# Patient Record
Sex: Female | Born: 1935 | Race: White | Hispanic: No | Smoking: Former smoker
Health system: Southern US, Community
[De-identification: ages and names within clinical notes are randomized; demographics above are authoritative.]

## PROBLEM LIST (undated history)

## (undated) DIAGNOSIS — I10 Essential (primary) hypertension: Secondary | ICD-10-CM

## (undated) DIAGNOSIS — E119 Type 2 diabetes mellitus without complications: Secondary | ICD-10-CM

## (undated) DIAGNOSIS — E785 Hyperlipidemia, unspecified: Secondary | ICD-10-CM

## (undated) DIAGNOSIS — E079 Disorder of thyroid, unspecified: Secondary | ICD-10-CM

## (undated) HISTORY — DX: Hyperlipidemia, unspecified: E78.5

## (undated) HISTORY — PX: BREAST BIOPSY: SHX20

## (undated) HISTORY — PX: GALLBLADDER SURGERY: SHX652

## (undated) HISTORY — DX: Essential (primary) hypertension: I10

## (undated) HISTORY — DX: Type 2 diabetes mellitus without complications: E11.9

## (undated) HISTORY — PX: EXCISIONAL HEMORRHOIDECTOMY: SHX1541

## (undated) HISTORY — DX: Disorder of thyroid, unspecified: E07.9

## (undated) HISTORY — PX: APPENDECTOMY: SHX54

---

## 2005-12-17 ENCOUNTER — Ambulatory Visit: Payer: Self-pay | Admitting: General Surgery

## 2006-01-08 ENCOUNTER — Other Ambulatory Visit: Payer: Self-pay

## 2006-01-08 ENCOUNTER — Ambulatory Visit: Payer: Self-pay | Admitting: General Surgery

## 2006-01-15 ENCOUNTER — Ambulatory Visit: Payer: Self-pay | Admitting: General Surgery

## 2007-04-02 ENCOUNTER — Inpatient Hospital Stay: Payer: Self-pay | Admitting: Internal Medicine

## 2007-04-02 ENCOUNTER — Other Ambulatory Visit: Payer: Self-pay

## 2014-11-21 DIAGNOSIS — E119 Type 2 diabetes mellitus without complications: Secondary | ICD-10-CM | POA: Diagnosis not present

## 2014-11-21 DIAGNOSIS — N179 Acute kidney failure, unspecified: Secondary | ICD-10-CM | POA: Diagnosis not present

## 2014-11-21 DIAGNOSIS — M7989 Other specified soft tissue disorders: Secondary | ICD-10-CM | POA: Diagnosis not present

## 2014-11-21 DIAGNOSIS — E039 Hypothyroidism, unspecified: Secondary | ICD-10-CM | POA: Diagnosis not present

## 2014-11-21 DIAGNOSIS — I10 Essential (primary) hypertension: Secondary | ICD-10-CM | POA: Diagnosis not present

## 2014-11-28 ENCOUNTER — Encounter: Payer: Self-pay | Admitting: *Deleted

## 2014-11-28 ENCOUNTER — Ambulatory Visit: Payer: Self-pay | Admitting: Cardiovascular Disease

## 2014-11-30 ENCOUNTER — Ambulatory Visit: Payer: Self-pay | Admitting: Nephrology

## 2014-11-30 DIAGNOSIS — N183 Chronic kidney disease, stage 3 (moderate): Secondary | ICD-10-CM | POA: Diagnosis not present

## 2014-12-01 ENCOUNTER — Inpatient Hospital Stay: Payer: Self-pay | Admitting: Internal Medicine

## 2014-12-01 DIAGNOSIS — R319 Hematuria, unspecified: Secondary | ICD-10-CM | POA: Diagnosis not present

## 2014-12-01 DIAGNOSIS — D649 Anemia, unspecified: Secondary | ICD-10-CM | POA: Diagnosis not present

## 2014-12-01 DIAGNOSIS — N040 Nephrotic syndrome with minor glomerular abnormality: Secondary | ICD-10-CM | POA: Diagnosis not present

## 2014-12-01 DIAGNOSIS — I120 Hypertensive chronic kidney disease with stage 5 chronic kidney disease or end stage renal disease: Secondary | ICD-10-CM | POA: Diagnosis not present

## 2014-12-01 DIAGNOSIS — N049 Nephrotic syndrome with unspecified morphologic changes: Secondary | ICD-10-CM | POA: Diagnosis not present

## 2014-12-01 DIAGNOSIS — D72829 Elevated white blood cell count, unspecified: Secondary | ICD-10-CM | POA: Diagnosis not present

## 2014-12-01 DIAGNOSIS — E119 Type 2 diabetes mellitus without complications: Secondary | ICD-10-CM | POA: Diagnosis not present

## 2014-12-01 DIAGNOSIS — Z6825 Body mass index (BMI) 25.0-25.9, adult: Secondary | ICD-10-CM | POA: Diagnosis not present

## 2014-12-01 DIAGNOSIS — R81 Glycosuria: Secondary | ICD-10-CM | POA: Diagnosis not present

## 2014-12-01 DIAGNOSIS — N170 Acute kidney failure with tubular necrosis: Secondary | ICD-10-CM | POA: Diagnosis not present

## 2014-12-01 DIAGNOSIS — L03114 Cellulitis of left upper limb: Secondary | ICD-10-CM | POA: Diagnosis not present

## 2014-12-01 DIAGNOSIS — Z87891 Personal history of nicotine dependence: Secondary | ICD-10-CM | POA: Diagnosis not present

## 2014-12-01 DIAGNOSIS — R2232 Localized swelling, mass and lump, left upper limb: Secondary | ICD-10-CM | POA: Diagnosis not present

## 2014-12-01 DIAGNOSIS — R531 Weakness: Secondary | ICD-10-CM | POA: Diagnosis not present

## 2014-12-01 DIAGNOSIS — M7989 Other specified soft tissue disorders: Secondary | ICD-10-CM | POA: Diagnosis not present

## 2014-12-01 DIAGNOSIS — F329 Major depressive disorder, single episode, unspecified: Secondary | ICD-10-CM | POA: Diagnosis not present

## 2014-12-01 DIAGNOSIS — R601 Generalized edema: Secondary | ICD-10-CM | POA: Diagnosis not present

## 2014-12-01 DIAGNOSIS — R600 Localized edema: Secondary | ICD-10-CM | POA: Diagnosis not present

## 2014-12-01 DIAGNOSIS — N179 Acute kidney failure, unspecified: Secondary | ICD-10-CM | POA: Diagnosis not present

## 2014-12-01 DIAGNOSIS — M1990 Unspecified osteoarthritis, unspecified site: Secondary | ICD-10-CM | POA: Diagnosis not present

## 2014-12-01 DIAGNOSIS — R809 Proteinuria, unspecified: Secondary | ICD-10-CM | POA: Diagnosis not present

## 2014-12-01 DIAGNOSIS — E039 Hypothyroidism, unspecified: Secondary | ICD-10-CM | POA: Diagnosis not present

## 2014-12-01 DIAGNOSIS — N189 Chronic kidney disease, unspecified: Secondary | ICD-10-CM | POA: Diagnosis not present

## 2014-12-01 DIAGNOSIS — E1122 Type 2 diabetes mellitus with diabetic chronic kidney disease: Secondary | ICD-10-CM | POA: Diagnosis not present

## 2014-12-01 DIAGNOSIS — E1140 Type 2 diabetes mellitus with diabetic neuropathy, unspecified: Secondary | ICD-10-CM | POA: Diagnosis not present

## 2014-12-01 DIAGNOSIS — T380X5A Adverse effect of glucocorticoids and synthetic analogues, initial encounter: Secondary | ICD-10-CM | POA: Diagnosis not present

## 2014-12-01 DIAGNOSIS — R310 Gross hematuria: Secondary | ICD-10-CM | POA: Diagnosis not present

## 2014-12-01 DIAGNOSIS — I129 Hypertensive chronic kidney disease with stage 1 through stage 4 chronic kidney disease, or unspecified chronic kidney disease: Secondary | ICD-10-CM | POA: Diagnosis not present

## 2014-12-01 DIAGNOSIS — Z992 Dependence on renal dialysis: Secondary | ICD-10-CM | POA: Diagnosis not present

## 2014-12-01 DIAGNOSIS — Z833 Family history of diabetes mellitus: Secondary | ICD-10-CM | POA: Diagnosis not present

## 2014-12-01 DIAGNOSIS — R05 Cough: Secondary | ICD-10-CM | POA: Diagnosis not present

## 2014-12-01 DIAGNOSIS — E1121 Type 2 diabetes mellitus with diabetic nephropathy: Secondary | ICD-10-CM | POA: Diagnosis not present

## 2014-12-01 DIAGNOSIS — I10 Essential (primary) hypertension: Secondary | ICD-10-CM | POA: Diagnosis not present

## 2014-12-01 DIAGNOSIS — E785 Hyperlipidemia, unspecified: Secondary | ICD-10-CM | POA: Diagnosis not present

## 2014-12-01 DIAGNOSIS — Z9981 Dependence on supplemental oxygen: Secondary | ICD-10-CM | POA: Diagnosis not present

## 2014-12-01 DIAGNOSIS — N183 Chronic kidney disease, stage 3 (moderate): Secondary | ICD-10-CM | POA: Diagnosis not present

## 2014-12-01 DIAGNOSIS — N186 End stage renal disease: Secondary | ICD-10-CM | POA: Diagnosis not present

## 2014-12-01 DIAGNOSIS — N050 Unspecified nephritic syndrome with minor glomerular abnormality: Secondary | ICD-10-CM | POA: Diagnosis not present

## 2014-12-01 LAB — BASIC METABOLIC PANEL
ANION GAP: 10 (ref 7–16)
BUN: 54 mg/dL — ABNORMAL HIGH (ref 7–18)
CALCIUM: 7.9 mg/dL — AB (ref 8.5–10.1)
CO2: 25 mmol/L (ref 21–32)
Chloride: 108 mmol/L — ABNORMAL HIGH (ref 98–107)
Creatinine: 4.56 mg/dL — ABNORMAL HIGH (ref 0.60–1.30)
GFR CALC AF AMER: 12 — AB
GFR CALC NON AF AMER: 10 — AB
Glucose: 105 mg/dL — ABNORMAL HIGH (ref 65–99)
OSMOLALITY: 300 (ref 275–301)
Potassium: 3.5 mmol/L (ref 3.5–5.1)
Sodium: 143 mmol/L (ref 136–145)

## 2014-12-01 LAB — CBC WITH DIFFERENTIAL/PLATELET
Basophil #: 0 10*3/uL (ref 0.0–0.1)
Basophil %: 0.5 %
Eosinophil #: 0.2 10*3/uL (ref 0.0–0.7)
Eosinophil %: 2.5 %
HCT: 35.4 % (ref 35.0–47.0)
HGB: 11.6 g/dL — ABNORMAL LOW (ref 12.0–16.0)
LYMPHS ABS: 1.3 10*3/uL (ref 1.0–3.6)
LYMPHS PCT: 20.1 %
MCH: 29.3 pg (ref 26.0–34.0)
MCHC: 32.9 g/dL (ref 32.0–36.0)
MCV: 89 fL (ref 80–100)
MONOS PCT: 7 %
Monocyte #: 0.5 x10 3/mm (ref 0.2–0.9)
Neutrophil #: 4.5 10*3/uL (ref 1.4–6.5)
Neutrophil %: 69.9 %
Platelet: 250 10*3/uL (ref 150–440)
RBC: 3.97 10*6/uL (ref 3.80–5.20)
RDW: 13.7 % (ref 11.5–14.5)
WBC: 6.4 10*3/uL (ref 3.6–11.0)

## 2014-12-01 LAB — PROTIME-INR
INR: 0.9
Prothrombin Time: 11.8 secs (ref 11.5–14.7)

## 2014-12-01 LAB — TSH: THYROID STIMULATING HORM: 19.5 u[IU]/mL — AB

## 2014-12-02 LAB — BASIC METABOLIC PANEL
Anion Gap: 9 (ref 7–16)
BUN: 53 mg/dL — ABNORMAL HIGH (ref 7–18)
CALCIUM: 7.1 mg/dL — AB (ref 8.5–10.1)
Chloride: 111 mmol/L — ABNORMAL HIGH (ref 98–107)
Co2: 24 mmol/L (ref 21–32)
Creatinine: 4.44 mg/dL — ABNORMAL HIGH (ref 0.60–1.30)
EGFR (African American): 12 — ABNORMAL LOW
EGFR (Non-African Amer.): 10 — ABNORMAL LOW
Glucose: 78 mg/dL (ref 65–99)
OSMOLALITY: 300 (ref 275–301)
POTASSIUM: 3.5 mmol/L (ref 3.5–5.1)
Sodium: 144 mmol/L (ref 136–145)

## 2014-12-02 LAB — URINALYSIS, COMPLETE
Bilirubin,UR: NEGATIVE
Glucose,UR: >=500 mg/dL (ref 0–75)
Ketone: NEGATIVE
Leukocyte Esterase: NEGATIVE
NITRITE: NEGATIVE
Ph: 5 (ref 4.5–8.0)
Specific Gravity: 1.014 (ref 1.003–1.030)
Squamous Epithelial: 4

## 2014-12-02 LAB — PROTEIN / CREATININE RATIO, URINE
Creatinine, Urine: 117.6 mg/dL (ref 30.0–125.0)
PROTEIN/CREAT. RATIO: 9770 mg/g{creat} — AB (ref 0–200)
Protein, Random Urine: 1149 mg/dL — ABNORMAL HIGH (ref 0–12)

## 2014-12-03 LAB — CBC WITH DIFFERENTIAL/PLATELET
BASOS ABS: 0 10*3/uL (ref 0.0–0.1)
Basophil %: 0.1 %
Eosinophil #: 0 10*3/uL (ref 0.0–0.7)
Eosinophil %: 0 %
HCT: 33.4 % — AB (ref 35.0–47.0)
HGB: 11 g/dL — ABNORMAL LOW (ref 12.0–16.0)
LYMPHS ABS: 0.5 10*3/uL — AB (ref 1.0–3.6)
LYMPHS PCT: 4.6 %
MCH: 29.6 pg (ref 26.0–34.0)
MCHC: 33 g/dL (ref 32.0–36.0)
MCV: 90 fL (ref 80–100)
MONO ABS: 0.1 x10 3/mm — AB (ref 0.2–0.9)
MONOS PCT: 1.3 %
Neutrophil #: 10.3 10*3/uL — ABNORMAL HIGH (ref 1.4–6.5)
Neutrophil %: 94 %
PLATELETS: 297 10*3/uL (ref 150–440)
RBC: 3.73 10*6/uL — ABNORMAL LOW (ref 3.80–5.20)
RDW: 13.8 % (ref 11.5–14.5)
WBC: 10.9 10*3/uL (ref 3.6–11.0)

## 2014-12-03 LAB — BASIC METABOLIC PANEL
ANION GAP: 12 (ref 7–16)
BUN: 61 mg/dL — AB (ref 7–18)
CO2: 23 mmol/L (ref 21–32)
CREATININE: 5.03 mg/dL — AB (ref 0.60–1.30)
Calcium, Total: 7.5 mg/dL — ABNORMAL LOW (ref 8.5–10.1)
Chloride: 105 mmol/L (ref 98–107)
GFR CALC AF AMER: 11 — AB
GFR CALC NON AF AMER: 9 — AB
GLUCOSE: 232 mg/dL — AB (ref 65–99)
Osmolality: 304 (ref 275–301)
Potassium: 3.8 mmol/L (ref 3.5–5.1)
SODIUM: 140 mmol/L (ref 136–145)

## 2014-12-04 LAB — CBC WITH DIFFERENTIAL/PLATELET
BASOS PCT: 0.1 %
Basophil #: 0 10*3/uL (ref 0.0–0.1)
EOS PCT: 0 %
Eosinophil #: 0 10*3/uL (ref 0.0–0.7)
HCT: 27.1 % — AB (ref 35.0–47.0)
HGB: 8.9 g/dL — AB (ref 12.0–16.0)
LYMPHS PCT: 4.1 %
Lymphocyte #: 0.7 10*3/uL — ABNORMAL LOW (ref 1.0–3.6)
MCH: 28.9 pg (ref 26.0–34.0)
MCHC: 32.7 g/dL (ref 32.0–36.0)
MCV: 88 fL (ref 80–100)
Monocyte #: 0.6 x10 3/mm (ref 0.2–0.9)
Monocyte %: 3.6 %
Neutrophil #: 14.9 10*3/uL — ABNORMAL HIGH (ref 1.4–6.5)
Neutrophil %: 92.2 %
Platelet: 260 10*3/uL (ref 150–440)
RBC: 3.07 10*6/uL — ABNORMAL LOW (ref 3.80–5.20)
RDW: 14 % (ref 11.5–14.5)
WBC: 16.1 10*3/uL — ABNORMAL HIGH (ref 3.6–11.0)

## 2014-12-04 LAB — BASIC METABOLIC PANEL
Anion Gap: 11 (ref 7–16)
BUN: 67 mg/dL — AB (ref 7–18)
CHLORIDE: 105 mmol/L (ref 98–107)
CREATININE: 5.38 mg/dL — AB (ref 0.60–1.30)
Calcium, Total: 7.2 mg/dL — ABNORMAL LOW (ref 8.5–10.1)
Co2: 23 mmol/L (ref 21–32)
EGFR (African American): 10 — ABNORMAL LOW
EGFR (Non-African Amer.): 8 — ABNORMAL LOW
Glucose: 133 mg/dL — ABNORMAL HIGH (ref 65–99)
Osmolality: 299 (ref 275–301)
POTASSIUM: 3.9 mmol/L (ref 3.5–5.1)
Sodium: 139 mmol/L (ref 136–145)

## 2014-12-05 LAB — BASIC METABOLIC PANEL
Anion Gap: 12 (ref 7–16)
BUN: 79 mg/dL — ABNORMAL HIGH (ref 7–18)
CALCIUM: 7.3 mg/dL — AB (ref 8.5–10.1)
CO2: 21 mmol/L (ref 21–32)
Chloride: 104 mmol/L (ref 98–107)
Creatinine: 5.61 mg/dL — ABNORMAL HIGH (ref 0.60–1.30)
EGFR (Non-African Amer.): 8 — ABNORMAL LOW
GFR CALC AF AMER: 9 — AB
Glucose: 154 mg/dL — ABNORMAL HIGH (ref 65–99)
OSMOLALITY: 301 (ref 275–301)
Potassium: 4.1 mmol/L (ref 3.5–5.1)
SODIUM: 137 mmol/L (ref 136–145)

## 2014-12-05 LAB — CBC WITH DIFFERENTIAL/PLATELET
Basophil #: 0 10*3/uL (ref 0.0–0.1)
Basophil %: 0.1 %
Eosinophil #: 0 10*3/uL (ref 0.0–0.7)
Eosinophil %: 0 %
HCT: 28.2 % — AB (ref 35.0–47.0)
HGB: 9.4 g/dL — ABNORMAL LOW (ref 12.0–16.0)
LYMPHS ABS: 0.4 10*3/uL — AB (ref 1.0–3.6)
Lymphocyte %: 2.7 %
MCH: 29.3 pg (ref 26.0–34.0)
MCHC: 33.2 g/dL (ref 32.0–36.0)
MCV: 88 fL (ref 80–100)
MONO ABS: 0.6 x10 3/mm (ref 0.2–0.9)
MONOS PCT: 3.7 %
NEUTROS ABS: 15.2 10*3/uL — AB (ref 1.4–6.5)
Neutrophil %: 93.5 %
PLATELETS: 273 10*3/uL (ref 150–440)
RBC: 3.2 10*6/uL — AB (ref 3.80–5.20)
RDW: 13.8 % (ref 11.5–14.5)
WBC: 16.3 10*3/uL — ABNORMAL HIGH (ref 3.6–11.0)

## 2014-12-06 LAB — BASIC METABOLIC PANEL
ANION GAP: 11 (ref 7–16)
BUN: 87 mg/dL — ABNORMAL HIGH (ref 7–18)
CHLORIDE: 103 mmol/L (ref 98–107)
Calcium, Total: 6.7 mg/dL — CL (ref 8.5–10.1)
Co2: 21 mmol/L (ref 21–32)
Creatinine: 5.39 mg/dL — ABNORMAL HIGH (ref 0.60–1.30)
EGFR (Non-African Amer.): 8 — ABNORMAL LOW
GFR CALC AF AMER: 10 — AB
GLUCOSE: 81 mg/dL (ref 65–99)
OSMOLALITY: 296 (ref 275–301)
Potassium: 4.4 mmol/L (ref 3.5–5.1)
Sodium: 135 mmol/L — ABNORMAL LOW (ref 136–145)

## 2014-12-06 LAB — CBC WITH DIFFERENTIAL/PLATELET
BASOS PCT: 0.1 %
Basophil #: 0 10*3/uL (ref 0.0–0.1)
EOS PCT: 0 %
Eosinophil #: 0 10*3/uL (ref 0.0–0.7)
HCT: 28.8 % — AB (ref 35.0–47.0)
HGB: 9.3 g/dL — ABNORMAL LOW (ref 12.0–16.0)
LYMPHS ABS: 1.2 10*3/uL (ref 1.0–3.6)
Lymphocyte %: 7.7 %
MCH: 29 pg (ref 26.0–34.0)
MCHC: 32.5 g/dL (ref 32.0–36.0)
MCV: 89 fL (ref 80–100)
MONO ABS: 1.3 x10 3/mm — AB (ref 0.2–0.9)
MONOS PCT: 8 %
NEUTROS ABS: 13.2 10*3/uL — AB (ref 1.4–6.5)
Neutrophil %: 84.2 %
Platelet: 270 10*3/uL (ref 150–440)
RBC: 3.22 10*6/uL — AB (ref 3.80–5.20)
RDW: 13.8 % (ref 11.5–14.5)
WBC: 15.7 10*3/uL — AB (ref 3.6–11.0)

## 2014-12-07 LAB — COMPREHENSIVE METABOLIC PANEL
ANION GAP: 11 (ref 7–16)
Albumin: 1.5 g/dL — ABNORMAL LOW (ref 3.4–5.0)
Alkaline Phosphatase: 66 U/L
BUN: 90 mg/dL — ABNORMAL HIGH (ref 7–18)
Bilirubin,Total: 0.2 mg/dL (ref 0.2–1.0)
CALCIUM: 7.2 mg/dL — AB (ref 8.5–10.1)
CO2: 20 mmol/L — AB (ref 21–32)
Chloride: 103 mmol/L (ref 98–107)
Creatinine: 5.6 mg/dL — ABNORMAL HIGH (ref 0.60–1.30)
EGFR (African American): 9 — ABNORMAL LOW
GFR CALC NON AF AMER: 8 — AB
Glucose: 71 mg/dL (ref 65–99)
Osmolality: 294 (ref 275–301)
POTASSIUM: 4.5 mmol/L (ref 3.5–5.1)
SGOT(AST): 25 U/L (ref 15–37)
SGPT (ALT): 18 U/L
Sodium: 134 mmol/L — ABNORMAL LOW (ref 136–145)
Total Protein: 4.6 g/dL — ABNORMAL LOW (ref 6.4–8.2)

## 2014-12-08 LAB — BASIC METABOLIC PANEL
Anion Gap: 8 (ref 7–16)
BUN: 82 mg/dL — ABNORMAL HIGH (ref 7–18)
CREATININE: 5.25 mg/dL — AB (ref 0.60–1.30)
Calcium, Total: 7.2 mg/dL — ABNORMAL LOW (ref 8.5–10.1)
Chloride: 103 mmol/L (ref 98–107)
Co2: 25 mmol/L (ref 21–32)
GFR CALC AF AMER: 10 — AB
GFR CALC NON AF AMER: 8 — AB
Glucose: 67 mg/dL (ref 65–99)
Osmolality: 295 (ref 275–301)
Potassium: 4.5 mmol/L (ref 3.5–5.1)
Sodium: 136 mmol/L (ref 136–145)

## 2014-12-08 LAB — URINALYSIS, COMPLETE
RBC,UR: 2252 /HPF (ref 0–5)
SPECIFIC GRAVITY: 1.02 (ref 1.003–1.030)
Squamous Epithelial: 7

## 2014-12-08 LAB — PROTEIN / CREATININE RATIO, URINE
CREATININE, URINE: 117.5 mg/dL (ref 30.0–125.0)
PROTEIN/CREAT. RATIO: 14911 mg/g{creat} — AB (ref 0–200)
Protein, Random Urine: 1752 mg/dL — ABNORMAL HIGH (ref 0–12)

## 2014-12-09 LAB — BASIC METABOLIC PANEL
ANION GAP: 9 (ref 7–16)
BUN: 54 mg/dL — AB (ref 7–18)
CALCIUM: 7.3 mg/dL — AB (ref 8.5–10.1)
CO2: 25 mmol/L (ref 21–32)
CREATININE: 4.17 mg/dL — AB (ref 0.60–1.30)
Chloride: 101 mmol/L (ref 98–107)
EGFR (Non-African Amer.): 11 — ABNORMAL LOW
GFR CALC AF AMER: 13 — AB
Glucose: 78 mg/dL (ref 65–99)
OSMOLALITY: 284 (ref 275–301)
Potassium: 4 mmol/L (ref 3.5–5.1)
SODIUM: 135 mmol/L — AB (ref 136–145)

## 2014-12-09 LAB — PHOSPHORUS: PHOSPHORUS: 6.4 mg/dL — AB (ref 2.5–4.9)

## 2014-12-09 LAB — PLATELET COUNT: Platelet: 196 10*3/uL (ref 150–440)

## 2014-12-10 DIAGNOSIS — R319 Hematuria, unspecified: Secondary | ICD-10-CM | POA: Diagnosis not present

## 2014-12-10 DIAGNOSIS — I10 Essential (primary) hypertension: Secondary | ICD-10-CM | POA: Diagnosis not present

## 2014-12-10 LAB — BASIC METABOLIC PANEL
Anion Gap: 6 — ABNORMAL LOW (ref 7–16)
BUN: 28 mg/dL — ABNORMAL HIGH (ref 7–18)
CO2: 32 mmol/L (ref 21–32)
CREATININE: 2.87 mg/dL — AB (ref 0.60–1.30)
Calcium, Total: 7.2 mg/dL — ABNORMAL LOW (ref 8.5–10.1)
Chloride: 102 mmol/L (ref 98–107)
GFR CALC AF AMER: 20 — AB
GFR CALC NON AF AMER: 17 — AB
Glucose: 89 mg/dL (ref 65–99)
Osmolality: 284 (ref 275–301)
POTASSIUM: 3.8 mmol/L (ref 3.5–5.1)
SODIUM: 140 mmol/L (ref 136–145)

## 2014-12-10 LAB — CBC WITH DIFFERENTIAL/PLATELET
BASOS ABS: 0 10*3/uL (ref 0.0–0.1)
BASOS PCT: 0.1 %
EOS ABS: 0.2 10*3/uL (ref 0.0–0.7)
Eosinophil %: 1.7 %
HCT: 25.5 % — ABNORMAL LOW (ref 35.0–47.0)
HGB: 8.5 g/dL — ABNORMAL LOW (ref 12.0–16.0)
LYMPHS PCT: 9.5 %
Lymphocyte #: 0.9 10*3/uL — ABNORMAL LOW (ref 1.0–3.6)
MCH: 29.3 pg (ref 26.0–34.0)
MCHC: 33.4 g/dL (ref 32.0–36.0)
MCV: 88 fL (ref 80–100)
MONO ABS: 1 x10 3/mm — AB (ref 0.2–0.9)
Monocyte %: 10.7 %
NEUTROS PCT: 78 %
Neutrophil #: 7.3 10*3/uL — ABNORMAL HIGH (ref 1.4–6.5)
PLATELETS: 178 10*3/uL (ref 150–440)
RBC: 2.91 10*6/uL — ABNORMAL LOW (ref 3.80–5.20)
RDW: 13.5 % (ref 11.5–14.5)
WBC: 9.4 10*3/uL (ref 3.6–11.0)

## 2014-12-11 LAB — BASIC METABOLIC PANEL
Anion Gap: 7 (ref 7–16)
BUN: 34 mg/dL — ABNORMAL HIGH (ref 7–18)
CALCIUM: 7.3 mg/dL — AB (ref 8.5–10.1)
CO2: 30 mmol/L (ref 21–32)
Chloride: 100 mmol/L (ref 98–107)
Creatinine: 3.8 mg/dL — ABNORMAL HIGH (ref 0.60–1.30)
GFR CALC AF AMER: 15 — AB
GFR CALC NON AF AMER: 12 — AB
GLUCOSE: 99 mg/dL (ref 65–99)
OSMOLALITY: 281 (ref 275–301)
Potassium: 3.7 mmol/L (ref 3.5–5.1)
SODIUM: 137 mmol/L (ref 136–145)

## 2014-12-11 LAB — HEMOGLOBIN: HGB: 10.2 g/dL — ABNORMAL LOW (ref 12.0–16.0)

## 2014-12-12 LAB — BASIC METABOLIC PANEL
ANION GAP: 9 (ref 7–16)
BUN: 42 mg/dL — ABNORMAL HIGH (ref 7–18)
CALCIUM: 7.1 mg/dL — AB (ref 8.5–10.1)
CO2: 29 mmol/L (ref 21–32)
Chloride: 99 mmol/L (ref 98–107)
Creatinine: 4.2 mg/dL — ABNORMAL HIGH (ref 0.60–1.30)
EGFR (African American): 13 — ABNORMAL LOW
EGFR (Non-African Amer.): 11 — ABNORMAL LOW
Glucose: 82 mg/dL (ref 65–99)
OSMOLALITY: 283 (ref 275–301)
Potassium: 3.7 mmol/L (ref 3.5–5.1)
Sodium: 137 mmol/L (ref 136–145)

## 2014-12-12 LAB — URINALYSIS, COMPLETE
Bacteria: NONE SEEN
Bilirubin,UR: NEGATIVE
Glucose,UR: >=500 mg/dL (ref 0–75)
Hyaline Cast: 10
Ketone: NEGATIVE
Leukocyte Esterase: NEGATIVE
Nitrite: NEGATIVE
PH: 6 (ref 4.5–8.0)
Specific Gravity: 1.028 (ref 1.003–1.030)
Squamous Epithelial: 15

## 2014-12-12 LAB — PHOSPHORUS: PHOSPHORUS: 5.3 mg/dL — AB (ref 2.5–4.9)

## 2014-12-14 DIAGNOSIS — I120 Hypertensive chronic kidney disease with stage 5 chronic kidney disease or end stage renal disease: Secondary | ICD-10-CM | POA: Diagnosis not present

## 2014-12-14 DIAGNOSIS — Z885 Allergy status to narcotic agent status: Secondary | ICD-10-CM | POA: Diagnosis not present

## 2014-12-14 DIAGNOSIS — N189 Chronic kidney disease, unspecified: Secondary | ICD-10-CM | POA: Diagnosis not present

## 2014-12-14 DIAGNOSIS — G40209 Localization-related (focal) (partial) symptomatic epilepsy and epileptic syndromes with complex partial seizures, not intractable, without status epilepticus: Secondary | ICD-10-CM | POA: Diagnosis not present

## 2014-12-14 DIAGNOSIS — B259 Cytomegaloviral disease, unspecified: Secondary | ICD-10-CM | POA: Diagnosis not present

## 2014-12-14 DIAGNOSIS — R410 Disorientation, unspecified: Secondary | ICD-10-CM | POA: Diagnosis not present

## 2014-12-14 DIAGNOSIS — F0390 Unspecified dementia without behavioral disturbance: Secondary | ICD-10-CM | POA: Diagnosis not present

## 2014-12-14 DIAGNOSIS — E119 Type 2 diabetes mellitus without complications: Secondary | ICD-10-CM | POA: Diagnosis not present

## 2014-12-14 DIAGNOSIS — G319 Degenerative disease of nervous system, unspecified: Secondary | ICD-10-CM | POA: Diagnosis not present

## 2014-12-14 DIAGNOSIS — E039 Hypothyroidism, unspecified: Secondary | ICD-10-CM | POA: Diagnosis not present

## 2014-12-14 DIAGNOSIS — D631 Anemia in chronic kidney disease: Secondary | ICD-10-CM | POA: Diagnosis not present

## 2014-12-14 DIAGNOSIS — M1990 Unspecified osteoarthritis, unspecified site: Secondary | ICD-10-CM | POA: Diagnosis not present

## 2014-12-14 DIAGNOSIS — Z79891 Long term (current) use of opiate analgesic: Secondary | ICD-10-CM | POA: Diagnosis not present

## 2014-12-14 DIAGNOSIS — G92 Toxic encephalopathy: Secondary | ICD-10-CM | POA: Diagnosis not present

## 2014-12-14 DIAGNOSIS — R319 Hematuria, unspecified: Secondary | ICD-10-CM | POA: Diagnosis not present

## 2014-12-14 DIAGNOSIS — D649 Anemia, unspecified: Secondary | ICD-10-CM | POA: Diagnosis not present

## 2014-12-14 DIAGNOSIS — R55 Syncope and collapse: Secondary | ICD-10-CM | POA: Diagnosis not present

## 2014-12-14 DIAGNOSIS — I313 Pericardial effusion (noninflammatory): Secondary | ICD-10-CM | POA: Diagnosis not present

## 2014-12-14 DIAGNOSIS — E1122 Type 2 diabetes mellitus with diabetic chronic kidney disease: Secondary | ICD-10-CM | POA: Diagnosis not present

## 2014-12-14 DIAGNOSIS — R601 Generalized edema: Secondary | ICD-10-CM | POA: Diagnosis not present

## 2014-12-14 DIAGNOSIS — Z881 Allergy status to other antibiotic agents status: Secondary | ICD-10-CM | POA: Diagnosis not present

## 2014-12-14 DIAGNOSIS — E1140 Type 2 diabetes mellitus with diabetic neuropathy, unspecified: Secondary | ICD-10-CM | POA: Diagnosis not present

## 2014-12-14 DIAGNOSIS — R310 Gross hematuria: Secondary | ICD-10-CM | POA: Diagnosis not present

## 2014-12-14 DIAGNOSIS — E876 Hypokalemia: Secondary | ICD-10-CM | POA: Diagnosis not present

## 2014-12-14 DIAGNOSIS — Z7952 Long term (current) use of systemic steroids: Secondary | ICD-10-CM | POA: Diagnosis not present

## 2014-12-14 DIAGNOSIS — R7989 Other specified abnormal findings of blood chemistry: Secondary | ICD-10-CM | POA: Diagnosis not present

## 2014-12-14 DIAGNOSIS — R569 Unspecified convulsions: Secondary | ICD-10-CM | POA: Diagnosis not present

## 2014-12-14 DIAGNOSIS — N186 End stage renal disease: Secondary | ICD-10-CM | POA: Diagnosis not present

## 2014-12-14 DIAGNOSIS — E785 Hyperlipidemia, unspecified: Secondary | ICD-10-CM | POA: Diagnosis not present

## 2014-12-14 DIAGNOSIS — R401 Stupor: Secondary | ICD-10-CM | POA: Diagnosis not present

## 2014-12-14 DIAGNOSIS — Z992 Dependence on renal dialysis: Secondary | ICD-10-CM | POA: Diagnosis not present

## 2014-12-14 DIAGNOSIS — I10 Essential (primary) hypertension: Secondary | ICD-10-CM | POA: Diagnosis not present

## 2014-12-14 DIAGNOSIS — Z7982 Long term (current) use of aspirin: Secondary | ICD-10-CM | POA: Diagnosis not present

## 2014-12-14 DIAGNOSIS — D72829 Elevated white blood cell count, unspecified: Secondary | ICD-10-CM | POA: Diagnosis not present

## 2014-12-14 DIAGNOSIS — I129 Hypertensive chronic kidney disease with stage 1 through stage 4 chronic kidney disease, or unspecified chronic kidney disease: Secondary | ICD-10-CM | POA: Diagnosis not present

## 2014-12-14 DIAGNOSIS — Z9981 Dependence on supplemental oxygen: Secondary | ICD-10-CM | POA: Diagnosis not present

## 2014-12-14 DIAGNOSIS — N050 Unspecified nephritic syndrome with minor glomerular abnormality: Secondary | ICD-10-CM | POA: Diagnosis not present

## 2014-12-14 DIAGNOSIS — T380X5A Adverse effect of glucocorticoids and synthetic analogues, initial encounter: Secondary | ICD-10-CM | POA: Diagnosis not present

## 2014-12-14 DIAGNOSIS — R809 Proteinuria, unspecified: Secondary | ICD-10-CM | POA: Diagnosis not present

## 2014-12-14 DIAGNOSIS — G936 Cerebral edema: Secondary | ICD-10-CM | POA: Diagnosis not present

## 2014-12-14 DIAGNOSIS — F329 Major depressive disorder, single episode, unspecified: Secondary | ICD-10-CM | POA: Diagnosis not present

## 2014-12-14 DIAGNOSIS — R32 Unspecified urinary incontinence: Secondary | ICD-10-CM | POA: Diagnosis not present

## 2014-12-14 DIAGNOSIS — Z9109 Other allergy status, other than to drugs and biological substances: Secondary | ICD-10-CM | POA: Diagnosis not present

## 2014-12-14 DIAGNOSIS — I63519 Cerebral infarction due to unspecified occlusion or stenosis of unspecified middle cerebral artery: Secondary | ICD-10-CM | POA: Diagnosis not present

## 2014-12-14 DIAGNOSIS — I469 Cardiac arrest, cause unspecified: Secondary | ICD-10-CM | POA: Diagnosis not present

## 2014-12-14 DIAGNOSIS — I959 Hypotension, unspecified: Secondary | ICD-10-CM | POA: Diagnosis not present

## 2014-12-14 DIAGNOSIS — N179 Acute kidney failure, unspecified: Secondary | ICD-10-CM | POA: Diagnosis not present

## 2014-12-14 DIAGNOSIS — R531 Weakness: Secondary | ICD-10-CM | POA: Diagnosis not present

## 2014-12-14 DIAGNOSIS — E1121 Type 2 diabetes mellitus with diabetic nephropathy: Secondary | ICD-10-CM | POA: Diagnosis not present

## 2014-12-14 LAB — CBC WITH DIFFERENTIAL/PLATELET
BASOS ABS: 0 10*3/uL (ref 0.0–0.1)
Basophil %: 0.1 %
Eosinophil #: 0 10*3/uL (ref 0.0–0.7)
Eosinophil %: 0 %
HCT: 27.9 % — ABNORMAL LOW (ref 35.0–47.0)
HGB: 9.4 g/dL — ABNORMAL LOW (ref 12.0–16.0)
Lymphocyte #: 0.7 10*3/uL — ABNORMAL LOW (ref 1.0–3.6)
Lymphocyte %: 4.7 %
MCH: 29.4 pg (ref 26.0–34.0)
MCHC: 33.7 g/dL (ref 32.0–36.0)
MCV: 87 fL (ref 80–100)
MONOS PCT: 5.1 %
Monocyte #: 0.7 x10 3/mm (ref 0.2–0.9)
Neutrophil #: 12.6 10*3/uL — ABNORMAL HIGH (ref 1.4–6.5)
Neutrophil %: 90.1 %
PLATELETS: 181 10*3/uL (ref 150–440)
RBC: 3.2 10*6/uL — AB (ref 3.80–5.20)
RDW: 13.6 % (ref 11.5–14.5)
WBC: 14 10*3/uL — ABNORMAL HIGH (ref 3.6–11.0)

## 2014-12-14 LAB — BASIC METABOLIC PANEL
Anion Gap: 8 (ref 7–16)
BUN: 38 mg/dL — AB (ref 7–18)
CALCIUM: 7.3 mg/dL — AB (ref 8.5–10.1)
Chloride: 100 mmol/L (ref 98–107)
Co2: 31 mmol/L (ref 21–32)
Creatinine: 3.87 mg/dL — ABNORMAL HIGH (ref 0.60–1.30)
GFR CALC AF AMER: 14 — AB
GFR CALC NON AF AMER: 12 — AB
Glucose: 112 mg/dL — ABNORMAL HIGH (ref 65–99)
Osmolality: 287 (ref 275–301)
Potassium: 4 mmol/L (ref 3.5–5.1)
SODIUM: 139 mmol/L (ref 136–145)

## 2014-12-14 LAB — PROTEIN, URINE, 24 HOUR
COLLECTION HOURS: 24 h
Protein, Urine: >2000 mg/dL — ABNORMAL HIGH (ref 0–12)
Total Volume: 100 mL

## 2014-12-14 LAB — PHOSPHORUS: PHOSPHORUS: 4.8 mg/dL (ref 2.5–4.9)

## 2014-12-15 DIAGNOSIS — I129 Hypertensive chronic kidney disease with stage 1 through stage 4 chronic kidney disease, or unspecified chronic kidney disease: Secondary | ICD-10-CM | POA: Diagnosis not present

## 2014-12-15 DIAGNOSIS — D72829 Elevated white blood cell count, unspecified: Secondary | ICD-10-CM | POA: Diagnosis not present

## 2014-12-15 DIAGNOSIS — E1140 Type 2 diabetes mellitus with diabetic neuropathy, unspecified: Secondary | ICD-10-CM | POA: Diagnosis not present

## 2014-12-15 DIAGNOSIS — R601 Generalized edema: Secondary | ICD-10-CM | POA: Diagnosis not present

## 2014-12-16 LAB — URINE CULTURE

## 2014-12-19 ENCOUNTER — Ambulatory Visit: Payer: Self-pay | Admitting: Internal Medicine

## 2014-12-20 DIAGNOSIS — N050 Unspecified nephritic syndrome with minor glomerular abnormality: Secondary | ICD-10-CM | POA: Diagnosis not present

## 2014-12-20 DIAGNOSIS — R601 Generalized edema: Secondary | ICD-10-CM | POA: Diagnosis not present

## 2014-12-20 DIAGNOSIS — R809 Proteinuria, unspecified: Secondary | ICD-10-CM | POA: Diagnosis not present

## 2014-12-20 DIAGNOSIS — R319 Hematuria, unspecified: Secondary | ICD-10-CM | POA: Diagnosis not present

## 2014-12-20 DIAGNOSIS — E785 Hyperlipidemia, unspecified: Secondary | ICD-10-CM | POA: Diagnosis not present

## 2014-12-28 DIAGNOSIS — R809 Proteinuria, unspecified: Secondary | ICD-10-CM | POA: Diagnosis not present

## 2014-12-28 DIAGNOSIS — R601 Generalized edema: Secondary | ICD-10-CM | POA: Diagnosis not present

## 2014-12-28 DIAGNOSIS — N179 Acute kidney failure, unspecified: Secondary | ICD-10-CM | POA: Diagnosis not present

## 2014-12-28 DIAGNOSIS — E1122 Type 2 diabetes mellitus with diabetic chronic kidney disease: Secondary | ICD-10-CM | POA: Diagnosis not present

## 2014-12-28 DIAGNOSIS — N050 Unspecified nephritic syndrome with minor glomerular abnormality: Secondary | ICD-10-CM | POA: Diagnosis not present

## 2015-01-03 ENCOUNTER — Inpatient Hospital Stay: Payer: Self-pay | Admitting: Internal Medicine

## 2015-01-03 DIAGNOSIS — I6523 Occlusion and stenosis of bilateral carotid arteries: Secondary | ICD-10-CM | POA: Diagnosis not present

## 2015-01-03 DIAGNOSIS — I10 Essential (primary) hypertension: Secondary | ICD-10-CM | POA: Diagnosis not present

## 2015-01-03 DIAGNOSIS — N050 Unspecified nephritic syndrome with minor glomerular abnormality: Secondary | ICD-10-CM | POA: Diagnosis not present

## 2015-01-03 DIAGNOSIS — E1140 Type 2 diabetes mellitus with diabetic neuropathy, unspecified: Secondary | ICD-10-CM | POA: Diagnosis not present

## 2015-01-03 DIAGNOSIS — R29810 Facial weakness: Secondary | ICD-10-CM | POA: Diagnosis not present

## 2015-01-03 DIAGNOSIS — I499 Cardiac arrhythmia, unspecified: Secondary | ICD-10-CM | POA: Diagnosis not present

## 2015-01-03 DIAGNOSIS — Z515 Encounter for palliative care: Secondary | ICD-10-CM | POA: Diagnosis not present

## 2015-01-03 DIAGNOSIS — R839 Unspecified abnormal finding in cerebrospinal fluid: Secondary | ICD-10-CM | POA: Diagnosis not present

## 2015-01-03 DIAGNOSIS — Z7952 Long term (current) use of systemic steroids: Secondary | ICD-10-CM | POA: Diagnosis not present

## 2015-01-03 DIAGNOSIS — I69254 Hemiplegia and hemiparesis following other nontraumatic intracranial hemorrhage affecting left non-dominant side: Secondary | ICD-10-CM | POA: Diagnosis not present

## 2015-01-03 DIAGNOSIS — G40209 Localization-related (focal) (partial) symptomatic epilepsy and epileptic syndromes with complex partial seizures, not intractable, without status epilepticus: Secondary | ICD-10-CM | POA: Diagnosis not present

## 2015-01-03 DIAGNOSIS — Z9981 Dependence on supplemental oxygen: Secondary | ICD-10-CM | POA: Diagnosis not present

## 2015-01-03 DIAGNOSIS — G936 Cerebral edema: Secondary | ICD-10-CM | POA: Diagnosis not present

## 2015-01-03 DIAGNOSIS — I120 Hypertensive chronic kidney disease with stage 5 chronic kidney disease or end stage renal disease: Secondary | ICD-10-CM | POA: Diagnosis not present

## 2015-01-03 DIAGNOSIS — Z885 Allergy status to narcotic agent status: Secondary | ICD-10-CM | POA: Diagnosis not present

## 2015-01-03 DIAGNOSIS — I129 Hypertensive chronic kidney disease with stage 1 through stage 4 chronic kidney disease, or unspecified chronic kidney disease: Secondary | ICD-10-CM | POA: Diagnosis not present

## 2015-01-03 DIAGNOSIS — I959 Hypotension, unspecified: Secondary | ICD-10-CM | POA: Diagnosis not present

## 2015-01-03 DIAGNOSIS — I63519 Cerebral infarction due to unspecified occlusion or stenosis of unspecified middle cerebral artery: Secondary | ICD-10-CM | POA: Diagnosis not present

## 2015-01-03 DIAGNOSIS — I313 Pericardial effusion (noninflammatory): Secondary | ICD-10-CM | POA: Diagnosis not present

## 2015-01-03 DIAGNOSIS — F0390 Unspecified dementia without behavioral disturbance: Secondary | ICD-10-CM | POA: Diagnosis not present

## 2015-01-03 DIAGNOSIS — R319 Hematuria, unspecified: Secondary | ICD-10-CM | POA: Diagnosis not present

## 2015-01-03 DIAGNOSIS — T380X5A Adverse effect of glucocorticoids and synthetic analogues, initial encounter: Secondary | ICD-10-CM | POA: Diagnosis not present

## 2015-01-03 DIAGNOSIS — G9349 Other encephalopathy: Secondary | ICD-10-CM | POA: Diagnosis not present

## 2015-01-03 DIAGNOSIS — R410 Disorientation, unspecified: Secondary | ICD-10-CM | POA: Diagnosis not present

## 2015-01-03 DIAGNOSIS — N186 End stage renal disease: Secondary | ICD-10-CM | POA: Diagnosis not present

## 2015-01-03 DIAGNOSIS — I69251 Hemiplegia and hemiparesis following other nontraumatic intracranial hemorrhage affecting right dominant side: Secondary | ICD-10-CM | POA: Diagnosis not present

## 2015-01-03 DIAGNOSIS — R601 Generalized edema: Secondary | ICD-10-CM | POA: Diagnosis not present

## 2015-01-03 DIAGNOSIS — R401 Stupor: Secondary | ICD-10-CM | POA: Diagnosis not present

## 2015-01-03 DIAGNOSIS — E876 Hypokalemia: Secondary | ICD-10-CM | POA: Diagnosis not present

## 2015-01-03 DIAGNOSIS — R918 Other nonspecific abnormal finding of lung field: Secondary | ICD-10-CM | POA: Diagnosis not present

## 2015-01-03 DIAGNOSIS — R809 Proteinuria, unspecified: Secondary | ICD-10-CM | POA: Diagnosis not present

## 2015-01-03 DIAGNOSIS — N170 Acute kidney failure with tubular necrosis: Secondary | ICD-10-CM | POA: Diagnosis not present

## 2015-01-03 DIAGNOSIS — R55 Syncope and collapse: Secondary | ICD-10-CM | POA: Diagnosis not present

## 2015-01-03 DIAGNOSIS — E119 Type 2 diabetes mellitus without complications: Secondary | ICD-10-CM | POA: Diagnosis not present

## 2015-01-03 DIAGNOSIS — N189 Chronic kidney disease, unspecified: Secondary | ICD-10-CM | POA: Diagnosis not present

## 2015-01-03 DIAGNOSIS — Z79891 Long term (current) use of opiate analgesic: Secondary | ICD-10-CM | POA: Diagnosis not present

## 2015-01-03 DIAGNOSIS — D638 Anemia in other chronic diseases classified elsewhere: Secondary | ICD-10-CM | POA: Diagnosis not present

## 2015-01-03 DIAGNOSIS — B259 Cytomegaloviral disease, unspecified: Secondary | ICD-10-CM | POA: Diagnosis not present

## 2015-01-03 DIAGNOSIS — E039 Hypothyroidism, unspecified: Secondary | ICD-10-CM | POA: Diagnosis not present

## 2015-01-03 DIAGNOSIS — Z9109 Other allergy status, other than to drugs and biological substances: Secondary | ICD-10-CM | POA: Diagnosis not present

## 2015-01-03 DIAGNOSIS — M1990 Unspecified osteoarthritis, unspecified site: Secondary | ICD-10-CM | POA: Diagnosis not present

## 2015-01-03 DIAGNOSIS — Z452 Encounter for adjustment and management of vascular access device: Secondary | ICD-10-CM | POA: Diagnosis not present

## 2015-01-03 DIAGNOSIS — I739 Peripheral vascular disease, unspecified: Secondary | ICD-10-CM | POA: Diagnosis not present

## 2015-01-03 DIAGNOSIS — Z7982 Long term (current) use of aspirin: Secondary | ICD-10-CM | POA: Diagnosis not present

## 2015-01-03 DIAGNOSIS — R531 Weakness: Secondary | ICD-10-CM | POA: Diagnosis not present

## 2015-01-03 DIAGNOSIS — D631 Anemia in chronic kidney disease: Secondary | ICD-10-CM | POA: Diagnosis not present

## 2015-01-03 DIAGNOSIS — N059 Unspecified nephritic syndrome with unspecified morphologic changes: Secondary | ICD-10-CM | POA: Diagnosis not present

## 2015-01-03 DIAGNOSIS — R32 Unspecified urinary incontinence: Secondary | ICD-10-CM | POA: Diagnosis not present

## 2015-01-03 DIAGNOSIS — G92 Toxic encephalopathy: Secondary | ICD-10-CM | POA: Diagnosis not present

## 2015-01-03 DIAGNOSIS — R569 Unspecified convulsions: Secondary | ICD-10-CM | POA: Diagnosis not present

## 2015-01-03 DIAGNOSIS — G8194 Hemiplegia, unspecified affecting left nondominant side: Secondary | ICD-10-CM | POA: Diagnosis not present

## 2015-01-03 DIAGNOSIS — D72829 Elevated white blood cell count, unspecified: Secondary | ICD-10-CM | POA: Diagnosis not present

## 2015-01-03 DIAGNOSIS — N2581 Secondary hyperparathyroidism of renal origin: Secondary | ICD-10-CM | POA: Diagnosis not present

## 2015-01-03 DIAGNOSIS — D649 Anemia, unspecified: Secondary | ICD-10-CM | POA: Diagnosis not present

## 2015-01-03 DIAGNOSIS — I6782 Cerebral ischemia: Secondary | ICD-10-CM | POA: Diagnosis not present

## 2015-01-03 DIAGNOSIS — R4182 Altered mental status, unspecified: Secondary | ICD-10-CM | POA: Diagnosis not present

## 2015-01-03 DIAGNOSIS — Z992 Dependence on renal dialysis: Secondary | ICD-10-CM | POA: Diagnosis not present

## 2015-01-03 DIAGNOSIS — N179 Acute kidney failure, unspecified: Secondary | ICD-10-CM | POA: Diagnosis not present

## 2015-01-03 DIAGNOSIS — F329 Major depressive disorder, single episode, unspecified: Secondary | ICD-10-CM | POA: Diagnosis not present

## 2015-01-03 DIAGNOSIS — I679 Cerebrovascular disease, unspecified: Secondary | ICD-10-CM | POA: Diagnosis not present

## 2015-01-03 DIAGNOSIS — G9340 Encephalopathy, unspecified: Secondary | ICD-10-CM | POA: Diagnosis not present

## 2015-01-03 DIAGNOSIS — R7989 Other specified abnormal findings of blood chemistry: Secondary | ICD-10-CM | POA: Diagnosis not present

## 2015-01-03 DIAGNOSIS — G319 Degenerative disease of nervous system, unspecified: Secondary | ICD-10-CM | POA: Diagnosis not present

## 2015-01-03 DIAGNOSIS — I639 Cerebral infarction, unspecified: Secondary | ICD-10-CM | POA: Diagnosis not present

## 2015-01-03 DIAGNOSIS — I469 Cardiac arrest, cause unspecified: Secondary | ICD-10-CM | POA: Diagnosis not present

## 2015-01-03 DIAGNOSIS — R310 Gross hematuria: Secondary | ICD-10-CM | POA: Diagnosis not present

## 2015-01-03 DIAGNOSIS — Z881 Allergy status to other antibiotic agents status: Secondary | ICD-10-CM | POA: Diagnosis not present

## 2015-01-03 DIAGNOSIS — E785 Hyperlipidemia, unspecified: Secondary | ICD-10-CM | POA: Diagnosis not present

## 2015-01-04 ENCOUNTER — Other Ambulatory Visit: Payer: Self-pay | Admitting: Physician Assistant

## 2015-01-04 DIAGNOSIS — R4182 Altered mental status, unspecified: Secondary | ICD-10-CM

## 2015-01-04 DIAGNOSIS — I499 Cardiac arrhythmia, unspecified: Secondary | ICD-10-CM

## 2015-01-04 DIAGNOSIS — I313 Pericardial effusion (noninflammatory): Secondary | ICD-10-CM

## 2015-01-04 DIAGNOSIS — R7989 Other specified abnormal findings of blood chemistry: Secondary | ICD-10-CM

## 2015-01-09 ENCOUNTER — Ambulatory Visit: Payer: Self-pay | Admitting: Neurology

## 2015-01-17 ENCOUNTER — Ambulatory Visit
Admit: 2015-01-17 | Disposition: A | Discharge status: Home / Self Care | Payer: Self-pay | Attending: Internal Medicine | Admitting: Internal Medicine

## 2015-01-18 DIAGNOSIS — D631 Anemia in chronic kidney disease: Secondary | ICD-10-CM | POA: Diagnosis not present

## 2015-01-18 DIAGNOSIS — E1165 Type 2 diabetes mellitus with hyperglycemia: Secondary | ICD-10-CM | POA: Diagnosis not present

## 2015-01-18 DIAGNOSIS — N186 End stage renal disease: Secondary | ICD-10-CM | POA: Diagnosis not present

## 2015-01-18 DIAGNOSIS — R0602 Shortness of breath: Secondary | ICD-10-CM | POA: Diagnosis not present

## 2015-01-18 DIAGNOSIS — I129 Hypertensive chronic kidney disease with stage 1 through stage 4 chronic kidney disease, or unspecified chronic kidney disease: Secondary | ICD-10-CM | POA: Diagnosis not present

## 2015-01-18 DIAGNOSIS — M1990 Unspecified osteoarthritis, unspecified site: Secondary | ICD-10-CM | POA: Diagnosis not present

## 2015-01-18 DIAGNOSIS — D649 Anemia, unspecified: Secondary | ICD-10-CM | POA: Diagnosis not present

## 2015-01-18 DIAGNOSIS — G9340 Encephalopathy, unspecified: Secondary | ICD-10-CM | POA: Diagnosis not present

## 2015-01-18 DIAGNOSIS — Z992 Dependence on renal dialysis: Secondary | ICD-10-CM | POA: Diagnosis not present

## 2015-01-18 DIAGNOSIS — I639 Cerebral infarction, unspecified: Secondary | ICD-10-CM | POA: Diagnosis not present

## 2015-01-18 DIAGNOSIS — R809 Proteinuria, unspecified: Secondary | ICD-10-CM | POA: Diagnosis not present

## 2015-01-18 DIAGNOSIS — R531 Weakness: Secondary | ICD-10-CM | POA: Diagnosis not present

## 2015-01-18 DIAGNOSIS — N2581 Secondary hyperparathyroidism of renal origin: Secondary | ICD-10-CM | POA: Diagnosis not present

## 2015-01-18 DIAGNOSIS — R400 Somnolence: Secondary | ICD-10-CM | POA: Diagnosis not present

## 2015-01-18 DIAGNOSIS — R569 Unspecified convulsions: Secondary | ICD-10-CM | POA: Diagnosis not present

## 2015-01-18 DIAGNOSIS — E1122 Type 2 diabetes mellitus with diabetic chronic kidney disease: Secondary | ICD-10-CM | POA: Diagnosis not present

## 2015-01-18 DIAGNOSIS — E1140 Type 2 diabetes mellitus with diabetic neuropathy, unspecified: Secondary | ICD-10-CM | POA: Diagnosis not present

## 2015-01-18 DIAGNOSIS — D638 Anemia in other chronic diseases classified elsewhere: Secondary | ICD-10-CM | POA: Diagnosis not present

## 2015-01-18 DIAGNOSIS — Z9981 Dependence on supplemental oxygen: Secondary | ICD-10-CM | POA: Diagnosis not present

## 2015-01-18 DIAGNOSIS — R601 Generalized edema: Secondary | ICD-10-CM | POA: Diagnosis not present

## 2015-01-18 DIAGNOSIS — I69251 Hemiplegia and hemiparesis following other nontraumatic intracranial hemorrhage affecting right dominant side: Secondary | ICD-10-CM | POA: Diagnosis not present

## 2015-01-18 DIAGNOSIS — F329 Major depressive disorder, single episode, unspecified: Secondary | ICD-10-CM | POA: Diagnosis not present

## 2015-01-18 DIAGNOSIS — I10 Essential (primary) hypertension: Secondary | ICD-10-CM | POA: Diagnosis not present

## 2015-01-18 DIAGNOSIS — E039 Hypothyroidism, unspecified: Secondary | ICD-10-CM | POA: Diagnosis not present

## 2015-01-18 DIAGNOSIS — E785 Hyperlipidemia, unspecified: Secondary | ICD-10-CM | POA: Diagnosis not present

## 2015-01-18 DIAGNOSIS — N050 Unspecified nephritic syndrome with minor glomerular abnormality: Secondary | ICD-10-CM | POA: Diagnosis not present

## 2015-01-18 DIAGNOSIS — I679 Cerebrovascular disease, unspecified: Secondary | ICD-10-CM | POA: Diagnosis not present

## 2015-01-18 DIAGNOSIS — D72829 Elevated white blood cell count, unspecified: Secondary | ICD-10-CM | POA: Diagnosis not present

## 2015-01-18 DIAGNOSIS — F0390 Unspecified dementia without behavioral disturbance: Secondary | ICD-10-CM | POA: Diagnosis not present

## 2015-01-18 DIAGNOSIS — I69254 Hemiplegia and hemiparesis following other nontraumatic intracranial hemorrhage affecting left non-dominant side: Secondary | ICD-10-CM | POA: Diagnosis not present

## 2015-01-19 DIAGNOSIS — I10 Essential (primary) hypertension: Secondary | ICD-10-CM | POA: Diagnosis not present

## 2015-01-19 DIAGNOSIS — E1165 Type 2 diabetes mellitus with hyperglycemia: Secondary | ICD-10-CM | POA: Diagnosis not present

## 2015-01-19 DIAGNOSIS — F0390 Unspecified dementia without behavioral disturbance: Secondary | ICD-10-CM | POA: Diagnosis not present

## 2015-01-19 DIAGNOSIS — I679 Cerebrovascular disease, unspecified: Secondary | ICD-10-CM | POA: Diagnosis not present

## 2015-01-19 DIAGNOSIS — D638 Anemia in other chronic diseases classified elsewhere: Secondary | ICD-10-CM | POA: Diagnosis not present

## 2015-01-26 DIAGNOSIS — R400 Somnolence: Secondary | ICD-10-CM | POA: Diagnosis not present

## 2015-01-26 DIAGNOSIS — R0602 Shortness of breath: Secondary | ICD-10-CM | POA: Diagnosis not present

## 2015-01-30 DIAGNOSIS — I10 Essential (primary) hypertension: Secondary | ICD-10-CM | POA: Diagnosis not present

## 2015-01-30 DIAGNOSIS — E1122 Type 2 diabetes mellitus with diabetic chronic kidney disease: Secondary | ICD-10-CM | POA: Diagnosis not present

## 2015-01-30 DIAGNOSIS — N186 End stage renal disease: Secondary | ICD-10-CM | POA: Diagnosis not present

## 2015-01-30 DIAGNOSIS — N050 Unspecified nephritic syndrome with minor glomerular abnormality: Secondary | ICD-10-CM | POA: Diagnosis not present

## 2015-01-30 DIAGNOSIS — R809 Proteinuria, unspecified: Secondary | ICD-10-CM | POA: Diagnosis not present

## 2015-02-17 ENCOUNTER — Ambulatory Visit
Admit: 2015-02-17 | Disposition: A | Discharge status: Home / Self Care | Payer: Self-pay | Attending: Internal Medicine | Admitting: Internal Medicine

## 2015-02-17 DEATH — deceased

## 2015-03-13 LAB — SURGICAL PATHOLOGY

## 2015-03-19 NOTE — Discharge Summary (Signed)
PATIENT NAME:  Jennifer Roberts, Jennifer Roberts MR#:  [id number] DATE OF BIRTH:  [birth year]  DATE OF ADMISSION:  12/01/2014 DATE OF DISCHARGE:  12/14/2014  ADMITTING DIAGNOSIS: Acute renal failure.   DISCHARGE DIAGNOSES: 1. 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 Wyn Quaker on 12/07/2014.  2. Hypertension.  3. Anasarca.  4. Nephrotic range proteinuria and hematuria, cystoscopy recommended as outpatient.  5. Diabetes mellitus type 2.  6. Hyperlipidemia.  7. Hypothyroidism.  8. Leukocytosis due to steroids.  9. Anemia.  10. Left arm swelling, but no deep vein thrombosis, suspected cellulitis, resolved on course of antibiotic therapy during this admission.  11. Depression with poor p.o. intake, seemed to be improving on Remeron.   DISCHARGE CONDITION: Stable.   DISCHARGE MEDICATIONS: The patient is to continue:  1. Levothyroxine 100 mcg p.o. daily.  2. Pravachol 40 mg p.o. at bedtime.  3. Losartan 100 mg p.o. daily.  4. Furosemide 80 mg p.o. daily.  5. Psyllium 1 packet once daily.  6. Prednisone 60 mg p.o. daily for 2 months then taper slowly according to nephrology's recommendations.  7. Maalox 15 mL every 6 hours as needed.  8. Tylenol 325 mg every 4 hours as needed.  9. Remeron 15 mg p.o. at bedtime.   10. Metoprolol tartrate 50 mg p.o. twice daily.   The patient is not to take amlodipine.   HOME OXYGEN: The patient will be discharged on oxygen at 1 liter of oxygen through nasal cannula at rest, as well as on exertion, to be tapered off as tolerated with portable tank.    DIET: 2-gram salt, low-fat, low-cholesterol, carbohydrate-controlled diet, also hemodialysis diet with 1500 mL fluid restriction in 24 hours, regular consistency.   ACTIVITY LIMITATIONS: As tolerated.   FOLLOWUP APPOINTMENTS: With Dr. Juanetta Gosling 2 days after discharge, Dr. Wynelle Link in 2 days after discharge,  as well as hemodialysis per schedule.   CONSULTANTS: Care management, social work, physical therapy, Dr. Mady Haagensen, Dr. Mosetta Pigeon, Dr. Festus Barren.   RADIOLOGIC STUDIES: Ultrasound guidance needle biopsy of the kidneys, 12/01/2014, performed by interventional radiologist: Successful ultrasound-guided random core biopsy of the lower pole of cortex of left kidney. Ultrasound of the left upper extremity, 12/06/2014, due to swelling, revealed no evidence of DVT.   HOSPITAL COURSE: The patient is an 81 year old Caucasian female with past medical history significant for history of hypertension, hyperlipidemia, hypothyroidism, diabetes mellitus, who was on metformin in the past, presents to the hospital with complaints of lower extremity swelling. Please refer to admission note dictated by Dr. Enedina Finner on 12/01/2014. Also please, refer to interim discharge summary dictated by Dr. Elby Showers on 12/09/2014. On arrival to the hospital, the patient was noted to have anasarca. Her vital signs showed that she was afebrile. Her pulse was 77, blood pressure 166/82, saturation was 98% on room air. Physical exam revealed 3+ pitting edema in lower extremities. The patient's lab data done on arrival to the hospital, 12/01/2014, showed elevated BUN and creatinine to 54 and 4.56; otherwise, BMP was unremarkable. Estimated GFR was 10. Her glucose level was 105. The patient's TSH level was elevated at 19.5. CBC: White blood cell count was 6.4, hemoglobin was 11.6, platelet count was 250 thousand, absolute neutrophil count was normal at 4.5. Coagulation panel was unremarkable. The patient's protein and creatinine ratio was 9770,  and protein random in uterine level was 1149. Urinalysis revealed more  than 500 protein, 2+ blood, 17 red blood cells, 6 white blood cells, 1+ bacteria, 4 epithelial cells, mucus was also present, but negative for nitrites or leukocyte esterase.   The patient was admitted to the hospital for  further evaluation. Consultation with neurologist was obtained, as well as endocrinologist. Nephrologist, Dr. Cherylann Ratel, saw the patient in consultation, and felt that the patient had worsening renal failure, acute renal failure with massive proteinuria of 17 grams on urine protein and creatinine ratio, as well as edema. He  recommended to start the patient on Solu-Medrol and get a biopsy. The patient underwent renal biopsy on the same day, 12/01/2014. The patient's kidney function was followed very closely, and she was noted to have worsening kidney function as time progressed. On 12/04/2014, the patient's creatinine was 5.38 and it worsened as time went on. On 12/07/2014, the patient's creatinine was 5.60 with low CO2 level of 20 and estimated GFR of 8 only. The patient had permanent hemodialysis catheter placed on 12/07/2013 by Dr. Wyn Quaker; hence, was started on hemodialysis. With this, her condition overall improved. Biopsy results came back as minimal change disease. The patient was initiated on prednisone p.o. The patient was advised to continue prednisone for 2 months. Nephrologist will follow the patient along while she was in the hospital. According to nephrologist, initial dose should be continued for 2 months and then, tapering should begin. Nephrologist also recommended to start the patient on Lasix at 80 mg daily dose, as well as losartan to reduce proteinuria. The patient underwent hemodialysis today on 12/14/2014, and then, she will be  continuing hemodialysis as outpatient according to the schedule. She is to follow up with nephrologist very closely.   In regards to hematuria, the patient was seen by urologist who felt that the patient's hematuria could have been related to worsening of hematuria or could have been related to biopsy; however, still felt the patient would benefit from urologic evaluation as an outpatient, including possibly cystoscopic evaluation as outpatient. It is recommended to follow  the patient as outpatient and refer her to urology as an outpatient. The patient was seen by urologist., Dr. Edwyna Ready  was the patient in consultation on January 23rd and 12/11/2014. The patient is to follow up with [facility] in the next few weeks after discharge, according to Dr. Edwyna Ready .   In regards to diabetes mellitus type 2, the patient's metformin was discontinued because of renal failure. The patient was advised to continue diabetic diet. She was hyperglycemic with steroids. It is recommended to initiate her on sliding scale insulin if her hyperglycemia is significant with 60 mg of prednisone daily.   In regards to hyperlipidemia, the patient is to continue her outpatient medication, pravastatin. No changes were made.    For hypothyroidism, she is to continue levothyroxine. The patient was seen by endocrinologist, Dr. Lendell Caprice, while the patient was in the hospital. With Dr. Lendell Caprice, the patient's TSH has been improving, since prior TSH checked by her primary care physician, Dr. Juanetta Gosling. Dr. Lendell Caprice recommended to continue current dose of levothyroxine and to check her TSH in approximately 4-6 weeks in the clinic, preferably endocrinology clinic for further recommendations. It is recommended to follow up with Dr. Lendell Caprice as outpatient for hypothyroidism management.   Regarding leukocytosis, the patient's white blood cell count was noted to be slightly elevated at 14.0 on 12/14/2014; however, since the patient was just recently started on steroids, we felt that the patient's leukocytosis very likely is due to  steroids. It is recommended to follow the patient's white blood cell count as an outpatient. The patient had no signs or symptoms of infection.   The patient was noted to have to left upper extremity swelling while she was in the hospital. She underwent Doppler ultrasound of her left upper extremity evaluation, which showed no deep vein thrombosis. It was felt that  patient very likely had cellulitis. She was initiated on antibiotic therapy with doxycycline and completed a 7-day antibiotic course. Her symptoms subsiding  and swelling is also subsiding with hemodialysis.   The patient was noted to have very poor p.o. intake, which was attributed to her kidney failure, which seemed to be improving; however, she was also felt to be depressed, so Remeron was initiated for her upon discharge. The patient is to continue Remeron, which needs to be advanced, depending on her needs.   The patient is being discharged in stable condition with the above-mentioned medications and follow-up.   VITAL SIGNS ON THE DAY OF DISCHARGE: Temperature was 98.1, pulse was 83, respiration rate was 18, blood pressure 139 to 146 systolic and 70s to 80s diastolic. Oxygen saturations was 95% on 1 liter of oxygen through nasal cannula at rest. It is recommended to taper or wean off the patient's oxygen therapy as time progressed with hemodialysis. It is also recommended to follow her blood pressure readings and advance her blood pressure medications depending on her needs.   The patient was evaluated by physical therapist and recommended, initially, home health; however, the patient decompensated over a period of days in the hospital and I felt that she would best be served to go to a short stay in a rehabilitation facility, where she will be discharged today on 12/14/2014.   TIME SPENT: 40 minutes.  ____________________________ Jennifer Caperima [place] Monds, MD rv:mw D: 12/14/2014 14:51:06 ET T: 12/14/2014 15:49:28 ET JOB#: [id number]  cc: Jennifer Caperima Marceline Napierala, MD, <Dictator> Sheng Pritz MD ELECTRONICALLY SIGNED 12/29/2014 15:40

## 2015-03-19 NOTE — Consult Note (Signed)
Admit Reason:   Hematuria (R31.9): Onset Date: 10-Dec-2014, Status: Active, Coding System: ICD10, Coded Name: Hematuria, unspecified, Description: Gross hematuria   Acute renal failure (N17.9): Status: Active, Coding System: ICD10, Coded Name: Acute kidney failure, unspecified   General Aspect Urology Consultation for gross hematuria Requesting provider: Dr. Myrtis Ser   Present Illness Pleasant patient presents for inpatient hospital admission due to acute on chronic kidney disease, anasarca, proteinura with ESRD s/p dialysis catheter placement for renal replacement therapy. For workup of her renal failure, patient underwent ultrasound guided renal biopsy on 12/01/13. She noted 2-3 day history of gross hematuria  4 days after the procedure. She did not note any blood in her urine in her morning void. She denies difficulty voiding, passage of clots. She denies any prior history of gross hematuria.   Home Medications: Medication Instructions Status  levothyroxine 100 mcg (0.1 mg) oral capsule 1 cap(s) orally once a day Active  Fiber Therapy 1   once a day Active  Lasix 20 mg oral tablet 1 tab(s) orally once a day Active  pravastatin 40 mg oral tablet 1 tab(s) orally once a day (at bedtime) Active  amLODIPine 5 mg oral tablet 1 tab(s) orally once a day evening Active  losartan 100 mg oral tablet 1 tab(s) orally once a day evening Active    Levaquin: Itching, Rash  Floxin: Itching, Rash  Case History and Physical Exam:  Chief Complaint blood in urine   Past Medical Health Hypertension, Smoking, hypothyroidism, ESRD   Past Surgical History dialysis catheter placement   Family History Coronary Artery Disease  Diabetes Mellitus  Social History: positive for tobacco, negative for illict drug use, negative for alcohol use   HEENT PERLA   Neck/Nodes Supple  No Adenopathy   Chest/Lungs Clear  no chest wall tenderness, no accesory muscle use   Cardiovascular Normal Sinus Rhythm    Abdomen Benign  no hepatosplenomegaly, no CVA tenderness   Genitalia Not examined    Rectal Not examined    Musculoskeletal Full range of motion   Neurological Grossly WNL   Skin Warm  Dry   Review of Systems:  Fever/Chills No   Cough No   Sputum No   Abdominal Pain No   Diarrhea No   Constipation Yes   Nausea/Vomiting No   SOB/DOE No   Chest Pain No   Dysuria No   Tolerating Diet Yes   Medications/Allergies Reviewed Medications/Allergies reviewed   Nursing/Ancillary Notes: **Vital Signs.:   23-Jan-16 03:42  Vital Signs Type Routine  Temperature Temperature (F) 97.6  Celsius 36.4  Temperature Source oral  Pulse Pulse 65  Respirations Respirations 20  Systolic BP Systolic BP 694  Diastolic BP (mmHg) Diastolic BP (mmHg) 71  Mean BP 97  Pulse Ox % Pulse Ox % 90  Pulse Ox Activity Level  At rest  Oxygen Delivery Room Air/ 21 %  Oxygen Delivery Adjusted To (RN or RCP Only)  2L; pt took O2 off and dropped,O2 replaced and reminded pt to not take it off    07:34  Vital Signs Type Q 8hr  Temperature Temperature (F) 98.1  Celsius 36.7  Temperature Source oral  Pulse Pulse 64  Respirations Respirations 18  Systolic BP Systolic BP 854  Diastolic BP (mmHg) Diastolic BP (mmHg) 68  Mean BP 99  Pulse Ox % Pulse Ox % 92  Pulse Ox Activity Level  At rest  Oxygen Delivery 2L  *Intake and Output.:   23-Jan-16 00:00  Grand Totals Intake:  Output:      Net:   40 Hr.:  -640    00:00  Urinary Method  Up to BR  Unmeasured Output  Void    02:47  [employer] Intake:   Output:      Net:   75 Hr.:  -616    02:47  Urinary Method  Up to BR  Unmeasured Output  Void    03:42  Grand Totals Intake:   Output:      Net:   24 Hr.:  -640    03:42  Weight Type daily  Weight Method Bed  Current Weight (lbs) (lbs) 149.5  Current Weight (kg) (kg) 67.8  Height (ft) (feet) 5  Height (in) (in) 2  Height (cm) centimeters 157.4  BSA (m2) 1.6  BMI (kg/m2) 27.3     Daily 07:00  Grand Totals Intake:  360 Output:  1000    Net:  -640 24 Hr.:  -640  Oral Intake      In:  360  Dialysis Fluid Removed (ml) ml     Out:  1000  Length of Stay Totals Intake:  0737 Output:  1062    Net:  6948    08:00  Grand Totals Intake:  120 Output:      Net:  120 24 Hr.:  120  Oral Intake      In:  120    08:00  Percentage of Meal Eaten  30    Shift 15:00  Grand Totals Intake:  120 Output:      Net:  120 24 Hr.:  120  Oral Intake      In:  120  Length of Stay Totals Intake:  5663 Output:  2275    Net:  5462     Thyroid:  14-Jan-16 14:07   Thyroid Stimulating Hormone  19.5 (0.45-4.50 (IU = International Unit)  ----------------------- Pregnant patients have  different reference  ranges for TSH:  - - - - - - - - - -  Pregnant, first trimetser:  0.36 - 2.50 uIU/mL)  LabObservation:  19-Jan-16 14:36   OBSERVATION Reason for Test Swelling  Routine BB:  14-Jan-16 15:04   ABO Group + Rh Type O Positive  Antibody Screen NEGATIVE (Result(s) reported on 01 Dec 2014 at 04:28PM.)  Routine Chem:  14-Jan-16 14:07   Glucose, Serum  105  BUN  54  Creatinine (comp)  4.56  Sodium, Serum 143  Potassium, Serum 3.5  Chloride, Serum  108  CO2, Serum 25  Calcium (Total), Serum  7.9  Anion Gap 10  Osmolality (calc) 300  eGFR (African American)  12  eGFR (Non-African American)  10 (eGFR values <15m/min/1.73 m2 may be an indication of chronic kidney disease (CKD). Calculated eGFR, using the MRDR Study equation, is useful in  patients with stable renal function. The eGFR calculation will not be reliable in acutely ill patients when serum creatinine is changing rapidly. It is not useful in patients on dialysis. The eGFR calculation may not be applicable to patients at the low and high extremes of body sizes, pregnant women, and vegetarians.)  21-Jan-16 16:39   Result Comment URINALYSIS - Unable to obtain valid dipstick results  - due to interference of  gross blood in the  - specimen.  Result(s) reported on 08 Dec 2014 at 05:21PM.  22-Jan-16 05:35   Glucose, Serum 78  BUN  54  Creatinine (comp)  4.17  Sodium, Serum  135  Potassium, Serum 4.0  Chloride, Serum 101  CO2, Serum 25  Calcium (Total), Serum  7.3  Anion Gap 9  Osmolality (calc) 284  eGFR (African American)  13  eGFR (Non-African American)  11 (eGFR values <8m/min/1.73 m2 may be an indication of chronic kidney disease (CKD). Calculated eGFR, using the MRDR Study equation, is useful in  patients with stable renal function. The eGFR calculation will not be reliable in acutely ill patients when serum creatinine is changing rapidly. It is not useful in patients on dialysis. The eGFR calculation may not be applicable to patients at the low and high extremes of body sizes, pregnant women, and vegetarians.)    14:08   Phosphorus, Serum  6.4 (Result(s) reported on 09 Dec 2014 at 02:35PM.)  23-Jan-16 03:36   Glucose, Serum 89  BUN  28  Creatinine (comp)  2.87  Sodium, Serum 140  Potassium, Serum 3.8  Chloride, Serum 102  CO2, Serum 32  Calcium (Total), Serum  7.2  Anion Gap  6  Osmolality (calc) 284  eGFR (African American)  20  eGFR (Non-African American)  17 (eGFR values <632mmin/1.73 m2 may be an indication of chronic kidney disease (CKD). Calculated eGFR, using the MRDR Study equation, is useful in  patients with stable renal function. The eGFR calculation will not be reliable in acutely ill patients when serum creatinine is changing rapidly. It is not useful in patients on dialysis. The eGFR calculation may not be applicable to patients at the low and high extremes of body sizes, pregnant women, and vegetarians.)  Misc Urine Chem:  15-Jan-16 16:50   Creatinine, Urine 117.6  Protein, Random Urine  1149  Protein/Creat Ratio (comp)  9770 (Result(s) reported on 02 Dec 2014 at 06:16PM.)  21-Jan-16 16:39   Creatinine, Urine 117.5  Protein, Random Urine  1752   Protein/Creat Ratio (comp)  14911 (Result(s) reported on 08 Dec 2014 at 06:15PM.)  Routine UA:  15-Jan-16 16:50   Color (UA) Yellow  Clarity (UA) Cloudy  Glucose (UA) >=500  Bilirubin (UA) Negative  Ketones (UA) Negative  Specific Gravity (UA) 1.014  Blood (UA) 2+  pH (UA) 5.0  Protein (UA) >=500  Nitrite (UA) Negative  Leukocyte Esterase (UA) Negative (Result(s) reported on 02 Dec 2014 at 05:15PM.)  RBC (UA) 17 /HPF  WBC (UA) 6 /HPF  Bacteria (UA) 1+  Epithelial Cells (UA) 4 /HPF  Mucous (UA) PRESENT (Result(s) reported on 02 Dec 2014 at 05:15PM.)  21-Jan-16 16:39   Color (UA) RED  Clarity (UA) BLOODY  Glucose (UA) see comment  Bilirubin (UA) see comment  Ketones (UA) see comment  Specific Gravity (UA) 1.020  Blood (UA) see comment  pH (UA) see comment  Protein (UA) see comment  Nitrite (UA) SEE COMMENT  Leukocyte Esterase (UA) see comment  RBC (UA) 2252 /HPF  WBC (UA) 57 /HPF  Bacteria (UA) 2+  Epithelial Cells (UA) 7 /HPF (Result(s) reported on 08 Dec 2014 at 05:21PM.)  Routine Coag:  14-Jan-16 14:07   Prothrombin 11.8  INR 0.9 (INR reference interval applies to patients on anticoagulant therapy. A single INR therapeutic range for coumarins is not optimal for all indications; however, the suggested range for most indications is 2.0 - 3.0. Exceptions to the INR Reference Range may include: Prosthetic heart valves, acute myocardial infarction, prevention of myocardial infarction, and combinations of aspirin and anticoagulant. The need for a higher or lower target INR must be assessed individually. Reference: The Pharmacology and Management of the Vitamin K  antagonists: the seventh ACCP Conference on Antithrombotic and  Thrombolytic Therapy. HGDJM.4268 Sept:126 (3suppl): [id number]. A HCT value >55% may artifactually increase the PT.  In one study,  the increase was an average of 25%. Reference:  "Effect on Routine and Special Coagulation Testing Values of Citrate  Anticoagulant Adjustment in Patients with High HCT Values." American Journal of Clinical Pathology 2006;126:400-405.)  Routine Hem:  14-Jan-16 14:07   WBC (CBC) 6.4  RBC (CBC) 3.97  Hemoglobin (CBC)  11.6  Hematocrit (CBC) 35.4  Platelet Count (CBC) 250  MCV 89  MCH 29.3  MCHC 32.9  RDW 13.7  Neutrophil % 69.9  Lymphocyte % 20.1  Monocyte % 7.0  Eosinophil % 2.5  Basophil % 0.5  Neutrophil # 4.5  Lymphocyte # 1.3  Monocyte # 0.5  Eosinophil # 0.2  Basophil # 0.0 (Result(s) reported on 01 Dec 2014 at 03:08PM.)  22-Jan-16 05:35   Platelet Count (CBC) 196 (Result(s) reported on 09 Dec 2014 at [facility].)  23-Jan-16 03:36   WBC (CBC) 9.4  RBC (CBC)  2.91  Hemoglobin (CBC)  8.5  Hematocrit (CBC)  25.5  Platelet Count (CBC) 178  MCV 88  MCH 29.3  MCHC 33.4  RDW 13.5  Neutrophil % 78.0  Lymphocyte % 9.5  Monocyte % 10.7  Eosinophil % 1.7  Basophil % 0.1  Neutrophil #  7.3  Lymphocyte #  0.9  Monocyte #  1.0  Eosinophil # 0.2  Basophil # 0.0 (Result(s) reported on 10 Dec 2014 at 04:24AM.)   [place]:    14-Jan-16 16:35, US Guide Needle Biopsy Other Than Breast  US Guide Needle Biopsy Other Than Breast   REASON FOR EXAM:    acute renal failure, proteinuria, nephrotic syndrome  COMMENTS:       PROCEDURE: [place]  - US GUIDED BX/ASPIRATION NOT BR  - Dec 01 2014  4:35PM     CLINICAL DATA:  81 year old female with acute renal failure,  proteinuria and nephrotic syndrome. Urgent ultrasound-guided core  biopsy of the kidney is requested.    EXAM:  ULTRASOUND CORE BIOPSY    Date: 12/01/2014    PROCEDURE:  1. Ultrasound-guided random biopsy of the lower pole of the left  renal cortex  Interventional Radiologist:  Criselda Peaches, MD    ANESTHESIA/SEDATION:  Moderate (conscious) sedation was used. 1.5 mg Versed, 50 mcg  Fentanyl were administered intravenously. The patient's vital signs  were monitored continuously by radiology nursing throughout  the  procedure.    Sedation Time: 30 minutes    MEDICATIONS:  None additional  TECHNIQUE:  Informed consent was obtained from the patient following explanation  of the procedure, risks, benefits and alternatives. The patient  understands, agrees and consents for the procedure. All questions  were addressed. A time out was performed.    Both flanks were interrogated with ultrasound. The lower pole of the  left kidney is easily accessible. A suitable skin entry site was  selected and marked. The region was then sterilely prepped and  draped in standard fashion using Betadine skin prep. Local  anesthesia was attained by infiltration with 1% lidocaine. A small  dermatotomy was made. Under real-time sonographic guidance, a total  of 3 16 gauge core biopsies were obtained using the bio Pince  automated biopsy device.  The biopsy specimens were placed in saline and sent to pathology for  further analysis. Pathology concluded adequate sampling.    Post biopsy ultrasound imaging demonstrates no evidence of active  hemorrhage or significant perinephric hematoma. The patient  tolerated the procedure well.  COMPLICATIONS:  None     IMPRESSION:  Successful ultrasound-guided random core biopsy of the lower pole  cortex of the left kidney.    Signed,  Criselda Peaches, MD    [facility]    [facility]      Electronically Signed    By: Jacqulynn Cadet M.D.    On: 12/01/2014 17:09         Verified By: Criselda Peaches, M.D.,    Impression Gross hematuria in the setting of recent renal biopsy in 12/01/2014 which may occur in 1-2 % of patients. Currently at the time of my assessment, her hematuria has resolved, although I was not able to view a sample of her urine. Bleeding may reflect bleeding at biopsy site into the collecting system.   Plan 1. Fluid hydration as tolerated in the setting of her known ESRD 2. Patient does  not require urgent endoscopic evaluation at this time 3. PVR ultrasound performed at the bedside by nursing was 340 ml . Patient currently has no pain nor feels the urge to void. Will continue to observe as she has been spontaneous voiding without issue. 4. Patient will need outpatient cystourethroscopy to assess for lower urinary tract etiologies for her hematuria. Will need retrograde pyelography for assessment of ureters given inability to provideer intravenous contrast for a CT study due to her ESRD  5. Should the patient re-develop hematuria, recommend renal color doppler US to assess for renal arteriovenous malformation which can occur post renal biopsy. 6. Plan of care discussed with the patient who communicated understanding 7. Will continue to follow along with the primary service.   Electronic Signatures: Tera Mater (MD)  (Signed 23-Jan-16 11:03)  Authored: Health Issues, General Aspect/Present Illness, Home Medications, Allergies, History and Physical Exam, Vital Signs, Labs, Radiology, Impression/Plan   Last Updated: 23-Jan-16 11:03 by Tera Mater (MD)

## 2015-03-19 NOTE — H&P (Signed)
PATIENT NAME:  Jennifer Roberts, Jennifer Roberts MR#:  [id number] DATE OF BIRTH:  06-23-1932  DATE OF ADMISSION:  01/03/2015  PRIMARY CARE PROVIDER:  Janeann Forehand., MD   NEPHROLOGIST:  Laverda Sorenson, MD   CHIEF COMPLAINT:  Syncope and confusion.   HISTORY OF PRESENT ILLNESS:  This is an 81 year old Caucasian female patient with a past medical history of hypertension, diabetes, hyperlipidemia, dementia, and end-stage renal disease on hemodialysis, who presents to the Emergency Room after she had an episode of syncope and confusion for about 20 minutes. The patient on arrival to the Emergency Room was found to have some left lower extremity weakness. Initially, TPA was considered. Neurologist, Dr. Alverda Skeans, saw the patient and thought this could be likely dialysis disequilibrium syndrome or status post seizure with Todd paralysis. The patient is being admitted to the hospitalist service for further workup and treatment.   The patient was recently diagnosed with minimal change disease and was started on hemodialysis. She has not had any dialysis for 3 weeks and has been on prednisone 60 mg daily. She follows up with Dr. Wynelle Link, but due to increasing anasarca, the patient was restarted on her dialysis today. Today's dialysis session was after 3 weeks. Presently, the patient is confused and does not follow commands completely. Initially, she had left lower extremity weakness, which seems to have resolved.   PAST MEDICAL HISTORY: 1.  Hypertension.  2.  Diabetes mellitus.  3.  End-stage renal disease on hemodialysis due to minimal change disease.  4.  Depression.  5.  Dementia.  6.  Malnutrition.  7.  Hypothyroidism.  8.  Hyperlipidemia.   SOCIAL HISTORY:  The patient does not smoke. No alcohol. No illicit drugs. She ambulates with a walker.   CODE STATUS:  DO NOT RESUSCITATE.   ALLERGIES TO:  QUINOLONES.   REVIEW OF SYSTEMS:  Not obtainable secondary to the patient's confusion and dementia.   HOME  MEDICATIONS:  1.  Prednisone 60 mg daily.  2.  Aspirin 81 mg daily.  3.  Calcium acetate 667 mg 2 tablets 3 times a day.  4.  Levothyroxine 0.1 mg daily.  5.  Losartan 100 mg daily.  6.  Maalox 15 mL every 6 hours as needed.  7.  Metoprolol tartrate 50 mg orally 2 times a day.  8.  Remeron 15 mg orally once a day.  9.  Pravastatin 40 mg a day.  10.  Valium one dose daily.  11.  Torsemide 20 mg 2 tablets 2 times a day.  12.  Tylenol 650 mg orally every 4 hours as needed.   FAMILY HISTORY:  Diabetes and cancer in her mother. Her sister has dementia.   PHYSICAL EXAMINATION: VITAL SIGNS:  Temperature 98.2, pulse 67, blood pressure initially 202/78 and presently 164/74, saturating 96% on room air.  GENERAL:  Moderately-built Caucasian female patient lying in bed, seems confused and restless but is alert and awake.  PSYCHIATRIC:  Alert and awake, but not oriented to person, place, or time. Anxious and restless.  HEENT:  Atraumatic, normocephalic. Oral mucosa is moist and pink. Pallor positive. No icterus. Pupils are bilaterally equal and reactive to light.  NECK:  Supple. No thyromegaly. No palpable lymph nodes. Trachea is midline. No JVD.  CARDIOVASCULAR:  S1 and S2 without any murmurs. Peripheral pulses are 2+ with 2+ edema. She has a dialysis catheter in the right subclavian.  RESPIRATORY:  Normal work of breathing. Clear to auscultation on both sides.  GASTROINTESTINAL:  Soft abdomen, nontender. Bowel sounds present. No organomegaly palpable.  SKIN:  Warm and dry. No petechiae or rash.  MUSCULOSKELETAL:  No joint swelling, redness, or effusion of the large joints.  NEUROLOGIC:  The patient does not follow commands well but seems to have normal strength on the right side. She has left-sided neglect with decreased strength, 4/5 in the left upper extremity and 4/5 in the left lower extremity. Reflexes are decreased. Babinski is downgoing. Cranial nerves II through XII are intact.   LABORATORY  STUDIES:  Glucose is 183, BUN 55, creatinine 1.23, sodium 142, potassium 2.9, chloride 99, bicarbonate 37, and anion gap is 6 with AST, ALT, alkaline phosphatase, and bilirubin normal. Troponin is 0.07. WBC is 22.5, hemoglobin 9.6, platelets 222,000. INR is 0.9.   Urinalysis shows no bacteria and no WBCs.   EKG shows normal sinus rhythm and nonspecific ST-T wave changes.   Chest x-ray, portable, shows no acute disease. She has a right chest tunneled dual-lumen dialysis catheter. Mild cardiomegaly.   CT scan of the head shows atrophy and small vessel disease. No acute bleed, infarction, or mass.   ASSESSMENT AND PLAN: 1.  Acute syncope, confusion, and left-sided weakness with left neglect. Possible causes at this time could be possible seizure or cerebrovascular accident. Also, confusion could be dialysis disequilibrium syndrome. The case was discussed with Dr. Alverda SkeansMatt Smith. We will get an MRI of the brain and order an EEG. No antiseizure medications advised. The patient will be on seizure precautions along with q. 4 hour neurological checks. May need to order an echocardiogram and Doppler if there is acute stroke on the MRI.  2.  Elevated troponin, very minimal at 0.07. She does not complain of any shortness of breath or chest pain, but did have syncope. This could be due to her elevated blood pressure in the setting of her kidney disease. We will check 2 more sets of troponin. If there is any elevation, she will need further workup with echocardiogram and possible cardiology evaluation.  3.  End-stage renal disease on hemodialysis with minimal change disease. Continue the prednisone. Consult nephrology for dialysis needs.  4.  Hypokalemia. Replace orally.  5.  Diabetes mellitus. Put her on sliding scale insulin.  6.  Anemia of chronic disease, stable.  7.  Leukocytosis. The patient's white count is elevated to 22.5. She does not have any fever, but she is also on prednisone, which could be causing  this elevation. We will get blood cultures to see if there is no line infection as the cause.  8.  Hypothyroidism. The patient's TSH was elevated during recent admission at 19.5. She seems to be getting her levothyroxine scheduled at the nursing home, but we will repeat the labs.  9.  Deep vein thrombosis prophylaxis with heparin.   CODE STATUS:  DO NOT RESUSCITATE/DO NOT INTUBATE.   TIME SPENT TODAY ON THIS CASE:  45 minutes.   ____________________________ Molinda BailiffSrikar R. Kanishk Stroebel, MD srs:nb D: 01/03/2015 15:44:39 ET T: 01/03/2015 15:58:18 ET JOB#: [id number]  cc: Wardell HeathSrikar R. Elpidio AnisSudini, MD, <Dictator> Janeann ForehandJames H. Hawkins Jr., MD Laverda SorensonSarath C. Kolluru, MD Orie FishermanSRIKAR R Lavilla Delamora MD ELECTRONICALLY SIGNED 01/04/2015 16:29

## 2015-03-19 NOTE — Consult Note (Signed)
PATIENT NAME:  Jennifer, Roberts MR#:  [id number] DATE OF BIRTH:  02/27/1932  DATE OF CONSULTATION:  12/02/2014  REFERRING PHYSICIAN:  Sona A. Posey Pronto, Roby:  A. Lavone Orn, MD PRIMARY CARE PHYSICIAN:  Arlis Porta., MD.  ENDOCRINOLOGY CONSULTATION  CHIEF COMPLAINT: Hypothyroidism.   HISTORY OF PRESENT ILLNESS: This is an 81 year old female with a past medical history of primary hypothyroidism, hypertension, type 2 diabetes and chronic kidney disease, who was admitted yesterday with significant lower extremity edema. The patient found to have acute on chronic kidney failure and evidence of anasarca. Additionally, was found to have an elevated TSH level of 19. She has a 5-10 year history of hypothyroidism. Taking levothyroxine 100 mcg per day. Recalls Dr. Luan Pulling increased her dose about 1 month ago, and family states that at that time her TSH was significantly higher at 91. She started feeling poorly only about 3 weeks ago when she noticed fatigue, cold intolerance and the lower extremity which got progressively worse. She is not taking her levothyroxine every day, estimates that she may skip a dose once a week unintentionally. She does use a pill box, but states she still may not necessarily always keep up with it. She is also taking her levothyroxine with other medications and just before her morning meal.   PAST MEDICAL HISTORY:  1. Hypertension.  2. Hyperlipidemia.  3. Hypothyroidism.  4. Type 2 diabetes.  5. Chronic kidney disease.   PAST SURGICAL HISTORY:  1. Appendectomy.  2. Cholecystectomy.  3. Hemorrhoidectomy.   SOCIAL HISTORY: The patient lives alone. She has 3 daughters who are at the bedside. She is a widow. No tobacco use, has a 50 pack-year history of tobacco use.   FAMILY HISTORY: Several family members with thyroid disease including 2 sisters and 1 granddaughter.   CURRENT INPATIENT MEDICATIONS:  1. Levothyroxine 100 mcg daily.  2. Amlodipine 5  mg daily.  3. Cozaar 100 mg daily.  4. Pravachol 40 mg daily.  5. Metamucil 1 packet once daily.  6. Heparin 5000 units subcutaneous q. 8 hours.  7. NovoLog insulin sliding scale.   ALLERGIES: LEVAQUIN, FLOXIN.  THESE MEDICATIONS BOTH CAUSE A RASH.   REVIEW OF SYSTEMS:   GENERAL: No weight loss. No fever.  HEENT: No blurred vision. No sore throat.  NECK: No neck pain. No dysphasia.  CARDIAC: No chest pain.  No palpitation.  PULMONARY: No cough. No shortness of breath.  ABDOMEN: Appetite is good. No constipation.  ENDOCRINE:  She has cold intolerance.  SKIN: Denies rash or recent skin changes. States skin is always dry.  EXTREMITIES: Reports lower extremity swelling. Feels this has improved over the last 24 hours.  NEUROLOGICAL:  Denies falls. Denies tremor.  GENITOURINARY: Denies dysuria. Has noted decreased urine output.  HEMATOLOGIC: Denies easy bruisability or recent bleeding.   PHYSICAL EXAMINATION:  VITAL SIGNS: Height 61.9 inches, weight 141.7 pounds, BMI 25.9. Temperature 97.9, pulse 74, respirations 18, blood pressure 134/74.  GENERAL: White female in no acute distress.  HEENT: Extraocular movements are intact.  Oropharynx is clear.  NECK: Supple. No thyromegaly. No palpable thyroid nodules.  No neck tenderness.  CARDIAC: No carotid bruit. Regular rate and rhythm.  PULMONARY: Clear to auscultation bilaterally. No wheeze.  ABDOMEN: Diffusely soft, nontender, nondistended.  EXTREMITIES: 2+ edema to the mid shin bilaterally.  SKIN: No rash or acute skin changes.  NEUROLOGIC: No dysarthria. No tremor.  PSYCHIATRIC: Calm, cooperative, alert and oriented.   LABORATORY DATA: 12/01/2014:  TSH  19.5.  12/02/2014:  Glucose 78, BUN 53, creatinine 4.44, sodium 144, potassium 3.5, EGFR 10, calcium 7.1.   ASSESSMENT: An 81 year old female with primary hypothyroidism, uncontrolled, in setting of acute on chronic renal failure and evidence of anasarca. Hypothyroidism could be  contributing to her fluid retention, fatigue and cold intolerance. This is unlikely to be cause of her severe acute renal failure.   RECOMMENDATIONS:  1. Agree with levothyroxine dosing as you are.  Per patient, TSH has improved over the last month on the current dose of levothyroxine.  2. Counseled the patient on the importance of compliance with daily levothyroxine. If she does unintentionally miss a dose, she can take 2 tablets the following day.  3. Recommend she take levothyroxine at least 30 minutes prior to morning meal. Take it only with water. Separate it from other morning medications. Taking the levothyroxine in this manner should enhance its absorption.  4. The patient will need outpatient follow-up. I have arranged for her to see me in clinic 4-6 weeks. We will recheck her thyroid function tests and adjust her dose of levothyroxine if needed.   Thank you for the kind request for consultation.     ____________________________ A. Lavone Orn, MD ams:by D: 12/02/2014 17:17:21 ET T: 12/02/2014 17:34:43 ET JOB#: [id number]  cc: A. Lavone Orn, MD, <Dictator> Arlis Porta., MD Gracy Bruins Dorse Locy MD ELECTRONICALLY SIGNED 12/13/2014 17:27

## 2015-03-19 NOTE — Consult Note (Signed)
Reason for Consult: Chief Complaint: AMS   History of Present Illness: History of Present Illness:   81 yo RHD F presented with confusion and questionable L weakness.  Pt does not complain but son is at bedside and states that she is far from this as her baseline and that she normally does most ADLs. status slightly improved but not back to baseline. s/p LP with elevated WBC  Past Medical/Surgical Hx:  denies:   Past Medical/ Surgical Hx:  Past Medical History ESRD, HTN, borderline DM   Past Surgical History dialysis cath   Home Medications: Medication Instructions Last Modified Date/Time  predniSONE 20 mg oral tablet 3 tab(s) orally once a day 16-Feb-16 15:33  calcium acetate 667 mg oral tablet 2 tab(s) orally 3 times a day with meals 16-Feb-16 15:33  Aspirin Low Dose 81 mg oral delayed release tablet 1 tab(s) orally once a day 16-Feb-16 15:33  torsemide 20 mg oral tablet 2 tab(s) orally 2 times a day.  16-Feb-16 15:33  psyllium 1 dose(s) orally once a day 16-Feb-16 15:33  Metoprolol Tartrate 50 mg oral tablet 1 tab(s) orally 2 times a day 16-Feb-16 15:33  Levoxyl 100 mcg (0.1 mg) oral tablet 1 tab(s) orally once a day 16-Feb-16 15:33  mirtazapine 15 mg oral tablet, disintegrating 1 tab(s) orally once a day (at bedtime) 16-Feb-16 15:33  Maalox Advanced Regular Strength 200 mg-200 mg-20 mg/5 mL oral suspension 15 milliliter(s) orally every 6 hours, As Needed - for Indigestion, Heartburn 16-Feb-16 15:33  acetaminophen 650 mg oral tablet, extended release 1 tab(s) orally every 4 hours, As Needed - for Pain 16-Feb-16 15:33  losartan 100 mg oral tablet 1 tab(s) orally once a day evening 16-Feb-16 15:33  pravastatin 40 mg oral tablet 1 tab(s) orally once a day (at bedtime) 16-Feb-16 15:33   Allergies:  Levaquin: Itching, Rash  Floxin: Itching, Rash  Vital Signs: **Vital Signs.:   23-Feb-16 00:57  Vital Signs Type Q 4hr  Temperature Temperature (F) 97.1  Celsius 36.1  Temperature  Source oral  Pulse Pulse 71  Respirations Respirations 20  Systolic BP Systolic BP 505  Diastolic BP (mmHg) Diastolic BP (mmHg) 71  Mean BP 101  Pulse Ox % Pulse Ox % 95  Pulse Ox Activity Level  At rest  Oxygen Delivery Room Air/ 21 %  Pulse Ox Heart Rate 85  Telemetry pattern Cardiac Rhythm Normal sinus rhythm    04:42  Vital Signs Type Routine  Temperature Temperature (F) 98.1  Celsius 36.7  Temperature Source oral  Pulse Pulse 76  Respirations Respirations 20  Systolic BP Systolic BP 397  Diastolic BP (mmHg) Diastolic BP (mmHg) 72  Mean BP 101  Pulse Ox % Pulse Ox % 95  Pulse Ox Activity Level  At rest  Oxygen Delivery Room Air/ 21 %  Pulse Ox Heart Rate 85  Telemetry pattern Cardiac Rhythm Normal sinus rhythm    13:07  Vital Signs Type Routine  Temperature Temperature (F) 98.1  Celsius 36.7  Temperature Source oral  Pulse Pulse 78  Respirations Respirations 20  Systolic BP Systolic BP 673  Diastolic BP (mmHg) Diastolic BP (mmHg) 76  Mean BP 105  Pulse Ox % Pulse Ox % 96  Pulse Ox Activity Level  At rest  Oxygen Delivery Room Air/ 21 %   EXAM: Pt is able to tell me name, does not know date, time. States hospital but does not know reason for being in hospital.  EOM intact Facial intact  Motor LUE weakness  with edema.   tongue midline.  Lab Results: Thyroid:  16-Feb-16 13:13   Thyroid Stimulating Hormone 1.48 (0.45-4.50 (IU = International Unit)  ----------------------- Pregnant patients have  different reference  ranges for TSH:  - - - - - - - - - -  Pregnant, first trimetser:  0.36 - 2.50 uIU/mL)  LabObservation:  17-Feb-16 19:06   OBSERVATION Reason for Test  Hepatic:  20-Feb-16 08:34   Bilirubin, Total 0.3  Bilirubin, Direct < 0.1 (Result(s) reported on 07 Jan 2015 at 10:07AM.)  Alkaline Phosphatase  181  SGPT (ALT)  65  SGOT (AST)  57  Total Protein, Serum  4.0  21-Feb-16 06:21   Albumin, Serum  1.3  Routine BB:  19-Feb-16 08:00   ABO  Group + Rh Type O Positive  Antibody Screen NEGATIVE (Result(s) reported on 06 Jan 2015 at 10:16AM.)  Crossmatch Unit 1 Transfused  Result(s) reported on 07 Jan 2015 at 06:55AM.  Routine Micro:  22-Feb-16 08:20   Micro Text Report CSF CC+PROT+GLU+CULT   COMMENT                   NO GROWTH IN 18-24 HOURS   GRAM STAIN                MODERATE RED BLOOD CELLS   GRAM STAIN                FEW WHITE BLOOD CELLS   GRAM STAIN                NO ORGANISMS SEEN   ANTIBIOTIC             Culture Comment NO GROWTH IN 18-24 HOURS  Gram Stain 1 MODERATE RED BLOOD CELLS  Gram Stain 2 FEW WHITE BLOOD CELLS  Gram Stain 3 NO ORGANISMS SEEN  Result(s) reported on 09 Jan 2015 at 11:07AM.  General Ref:  20-Feb-16 12:06   CRP, High Sensitivity ========== TEST NAME ==========  ========= RESULTS =========  = REFERENCE RANGE =  CRP, HIGH SENSITIVITY  C-Reactive Protein, Cardiac C-Reactive Protein, Cardiac     [H  13.46 mg/L           ]         0.00-3.00                         Relative Risk for Future Cardiovascular Event                                             Low                 <1.00                                             Average       1.00 - 3.00                                             High                >3.00  [facility]            No: [id number]           [address] [place], [place] [address]           Lindon Romp, MD         [phone]   Result(s) reported on 10 Jan 2015 at 07:29AM.  ANA Comprehensive Panel ========== TEST NAME ==========  ========= RESULTS =========  = REFERENCE RANGE =  ANA COMPREHENSIVE PANEL  ANA Comprehensive Panel Anti-DNA (DS) Ab Qn             [   <1 IU/mL             ]               0-9               Negative      <5                                                   Equivocal  5 - 9                                                   Positive      >9 RNP Antibodies                  [   <0.2 AI              ]            0.0-0.9 Smith Antibodies                [   <0.2 AI              ]           0.0-0.9 Antiscleroderma-70 Antibodies   [   <0.2 AI              ]           0.0-0.9 Sjogren's Anti-SS-A             [   <0.2 AI              ]           0.0-0.9 Sjogren's Anti-SS-B          [   <0.2 AI              ]           0.0-0.9 Antichromatin Antibodies        [   <0.2 AI              ]           0.0-0.9 Anti-Jo-1                       [   <0.2 AI              ]           0.0-0.9 Anti-Centromere B Antibodies    [   <0.2 AI              ]  0.0-0.9 See below:                      [   Final Report         ]                   Autoantibody                       Disease Association ------------------------------------------------------------         Condition                  Frequency ---------------------   ------------------------   --------- Antinuclear Antibody,    SLE, mixed connective Direct (ANA-D)           tissue diseases ---------------------   ------------------------   --------- dsDNA                    SLE                        40 - 60% ---------------------   ------------------------   --------- Chromatin                Drug induced SLE                90%                          SLE                        48 - 97% ---------------------   ------------------------   --------- SSA (Ro)                 SLE                        25 - 35%                          Sjogren's Syndrome         40 - 70%                          Neonatal Lupus                 100% ---------------------   ------------------------   --------- SSB (La)                 SLE                             10%                          Sjogren's Syndrome              30% ---------------------   -----------------------    --------- Sm (anti-Smith)          SLE                        15 - 30% ---------------------   -----------------------    --------- RNP                      Mixed Connective Tissue  Disease                         95% (U1 nRNP,          SLE                        30 - 50% anti-ribonucleoprotein)  Polymyositis and/or                          Dermatomyositis                 20% ---------------------   ------------------------   --------- Scl-70 (antiDNA          Scleroderma (diffuse)      20 - 35% topoisomerase)           Crest                           13% ---------------------   ------------------------   --------- Jo-1                     Polymyositis and/or                          Dermatomyositis            20 - 40% ---------------------   ------------------------   --------- Centromere B             Scleroderma - Crest                          variant                         80%               [facility]            No: [id number]  [address], [place], [place] [address]           Lindon Romp, MD         [phone]   Result(s) reported on 10 Jan 2015 at 07:24AM.  HBsAg ========== TEST NAME ==========  ========= RESULTS =========  = REFERENCE RANGE =  HEPATITIS B SURFACE AG  HBsAg Screen HBsAg Screen                    [   Negative             ]          Negative               [facility]            No: [id number]           [address] [place], [place] [address]           Lindon Romp, MD         [phone]   Result(s) reported on 09 Jan 2015 at 05:19PM.  Hepatitis B Surface Antibody, Qual ========== TEST NAME ==========  ========= RESULTS =========  = REFERENCE RANGE =  HEPATITIS B SURF.AB,QUAL  Hep B Surface Ab Hep B Surface Ab, Qual          [   Non Reactive         ]  Non Reactive: Inconsistent with immunity,                                            less than 10 mIU/mL                              Reactive:     Consistent with immunity,                                            greater than 9.9 mIU/mL               [facility]            No:  [id number]           [address], [place], [place] [address]           Lindon Romp, MD         [phone]   Result(s) reported on 10 Jan 2015 at 07:27AM.  Routine Chem:  18-Feb-16 04:52   Magnesium, Serum 1.9 (1.8-2.4 THERAPEUTIC RANGE: 4-7 mg/dL TOXIC: > 10 mg/dL  -----------------------)  21-Feb-16 06:21   Phosphorus, Serum 3.1  22-Feb-16 18:51   Result Comment DIFFERENTIAL - SMEAR SCANNED  Result(s) reported on 09 Jan 2015 at [facility].  23-Feb-16 05:35   Glucose, Serum  102  BUN  29  Creatinine (comp) 1.29  Sodium, Serum  146  Potassium, Serum  3.4  Chloride, Serum 106  CO2, Serum  33  Calcium (Total), Serum  7.2  Anion Gap 7  Osmolality (calc) 297  eGFR (African American) 51  eGFR (Non-African American)  42 (eGFR values <62m/min/1.73 m2 may be an indication of chronic kidney disease (CKD). Calculated eGFR, using the MRDR Study equation, is useful in  patients with stable renal function. The eGFR calculation will not be reliable in acutely ill patients when serum creatinine is changing rapidly. It is not useful in patients on dialysis. The eGFR calculation may not be applicable to patients at the low and high extremes of body sizes, pregnant women, and vegetarians.)  Cardiac:  17-Feb-16 00:31   Troponin I  0.20 (0.00-0.05 0.05 ng/mL or less: NEGATIVE  Repeat testing in 3-6 hrs  if clinically indicated. >0.05 ng/mL: POTENTIAL  MYOCARDIAL INJURY. Repeat  testing in 3-6 hrs if  clinically indicated. NOTE: An increase or decrease  of 30% or more on serial  testing suggests a  clinically important change)  Routine UA:  16-Feb-16 13:51   Color (UA) Yellow  Clarity (UA) Clear  Glucose (UA) >=500  Bilirubin (UA) Negative  Ketones (UA) Negative  Specific Gravity (UA) 1.010  Blood (UA) Negative  pH (UA) 7.0  Protein (UA) >=500  Nitrite (UA) Negative  Leukocyte Esterase (UA) Negative (Result(s) reported on 03 Jan 2015 at 02:34PM.)  RBC (UA) 1 /HPF   WBC (UA) 2 /HPF  Bacteria (UA) NONE SEEN  Epithelial Cells (UA) 1 /HPF  Hyaline Cast (UA) 10 /LPF (Result(s) reported on 03 Jan 2015 at 02:34PM.)  CSF Analysis:  22-Feb-16 08:20   CSF Tube # 3  Color - Uncentrifuged (CSF) COLORLESS  Clarity (CSF) CLEAR  RBC  (CSF) 5  WBC  (CSF) 17  Neutrophils  (CSF) 45  Lymphocytes  (CSF) 16  Monocytes/Macrophages  (CSF) 39  Eosinophil  (CSF) 0  Other Cells  (CSF) 0 (Result(s) reported on 09 Jan 2015 at 11:07AM.)  Protein, CSF 24 (Result(s) reported on 09 Jan 2015 at 11:07AM.)  Glucose, CSF  82 (Result(s) reported on 09 Jan 2015 at 11:07AM.)  Routine Coag:  19-Feb-16 20:31   Prothrombin 12.1 (11.4-15.0 NOTE: New Reference Range  12/16/14)  INR 0.9 (INR reference interval applies to patients on anticoagulant therapy. A single INR therapeutic range for coumarins is not optimal for all indications; however, the suggested range for most indications is 2.0 - 3.0. Exceptions to the INR Reference Range may include: Prosthetic heart valves, acute myocardial infarction, prevention of myocardial infarction, and combinations of aspirin and anticoagulant. The need for a higher or lower target INR must be assessed individually. Reference: The Pharmacology and Management of the Vitamin K  antagonists: the seventh ACCP Conference on Antithrombotic and Thrombolytic Therapy. VEHMC.9470 Sept:126 (3suppl): [id number]. A HCT value >55% may artifactually increase the PT.  In one study,  the increase was an average of 25%. Reference:  "Effect on Routine and Special Coagulation Testing Values of Citrate Anticoagulant Adjustment in Patients with High HCT Values." American Journal of Clinical Pathology 2006;126:400-405.)  Activated PTT (APTT) 33.2 (A HCT value >55% may artifactually increase the APTT. In one study, the increase was an average of 19%. Reference: "Effect on Routine and Special Coagulation Testing Values of Citrate Anticoagulant Adjustment in  Patients with High HCT Values." American Journal of Clinical Pathology 2006;126:400-405.)  Routine Hem:  20-Feb-16 12:06   Erythrocyte Sed Rate  67 (Result(s) reported on 07 Jan 2015 at 03:34PM.)  21-Feb-16 06:21   Variant Lymphocytes -  Basophil -  Myelocyte -  Promyelocyte -  Blast-Like -  Other Cells -  NRBC -  Diff Comment 3 -  Diff Comment 4 -  Diff Comment 5 -  Diff Comment 6 -  Diff Comment 7 -  Diff Comment 8 -  Diff Comment 9 -  Diff Comment 10 - (Result(s) reported on 08 Jan 2015 at 07:13AM.)    07:25   Bands 2  Segmented Neutrophils 88  Lymphocytes 6  Monocytes 2  Eosinophil 1  Metamyelocyte 1  Diff Comment 1 RBCs APPEAR NORMAL  Diff Comment 2 NORMAL PLT MORPHOLGY  Result(s) reported on 08 Jan 2015 at 07:44AM.  23-Feb-16 05:35   WBC (CBC)  13.7  RBC (CBC)  2.89  Hemoglobin (CBC)  8.8  Hematocrit (CBC)  25.7  Platelet Count (CBC)  115  MCV 89  MCH 30.3  MCHC 34.1  RDW 14.1  Neutrophil % 88.3  Lymphocyte % 6.4  Monocyte % 5.2  Eosinophil % 0.0  Basophil % 0.1  Neutrophil #  12.1  Lymphocyte #  0.9  Monocyte # 0.7  Eosinophil # 0.0  Basophil # 0.0 (Result(s) reported on 10 Jan 2015 at 06:15AM.)   Radiology Results: MRI:    17-Feb-16 10:12, MRI Brain Without Contrast  MRI Brain Without Contrast   REASON FOR EXAM:    cva. Left weakness  COMMENTS:       PROCEDURE: MR  - MR BRAIN WO CONTRAST  - Jan 04 2015 10:12AM     CLINICAL DATA:  Altered mental status. Seizure with subsequent left  facial droop.    EXAM:  MRI HEAD WITHOUT CONTRAST    TECHNIQUE:  Multiplanar, multiecho pulse sequences of the brain and surrounding  structures were obtained without intravenous contrast.  COMPARISON:  CT head without contrast 01/03/2015. MRI brain  04/03/2007.    FINDINGS:  The diffusion-weighted images demonstrate no evidence for acute or  subacute infarction. Atrophy and white matter disease is evident  bilaterally. No acute hemorrhage or mass lesion  is present. The  ventricles are proportionate to the degree of atrophy. Flow is  present in the major intracranial arteries.    The globes and orbits are intact. Midline structures are within  normal limits. The paranasal sinuses and mastoid air cells are  clear.     IMPRESSION:  1. Moderate atrophy and white matter disease. This likely reflects  the sequela of chronic microvascular ischemia.  2. No acute intracranial abnormality or focal lesion to explain the  patient's facial droop or altered mental status.      Electronically Signed    By: San Morelle M.D.    On: 01/04/2015 10:20         Verified By: Resa Miner. MATTERN, M.D.,  CT:    16-Feb-16 13:34, CT Head Without Contrast  CT Head Without Contrast   REASON FOR EXAM:    confusion with syncope  COMMENTS:   May transport without cardiac monitor    PROCEDURE: CT  - CT HEAD WITHOUT CONTRAST  - Jan 03 2015  1:34PM     CLINICAL DATA:  Confusion with syncope    EXAM:  CT HEAD WITHOUT CONTRAST    TECHNIQUE:  Contiguous axial images were obtained from the base of the skull  through the vertex without intravenous contrast.    COMPARISON:  MRI brain 04/03/2007  FINDINGS:  There is no evidence of mass effect, midline shift, or extra-axial  fluid collections. There is no evidence of a space-occupying lesion  or intracranial hemorrhage. There is no evidence of a cortical-based  area of acute infarction. There is generalized cerebral atrophy.  There is periventricular white matter low attenuation likely  secondary to microangiopathy.    The ventricles and sulci are appropriate for the patient's age. The  basal cisterns are patent.    Visualized portions of the orbits are unremarkable. The visualized  portions of the paranasal sinuses and mastoid aircells are  unremarkable. Cerebrovascular atherosclerotic calcifications are  noted.  The osseous structures are unremarkable.     IMPRESSION:  1. No acute  intracranial pathology.  2. Chronic microvascular disease and cerebral atrophy.      Electronically Signed    By: Kathreen Devoid    On: 01/03/2015 14:10         Verified By: Jennette Banker, M.D., MD   Impression/Recommendations: Recommendations:   81 yo RHD F presented with confusion and questionable L weakness.  Pt does not complain but son is at bedside and states that she is far from this as her baseline and that she normally does most ADLs. LP with elevated glc, and cells out of proportion in relation to the RBC. 17 wbc to 5 RBChas elevated WBC in blood, but could be demargenation from chronic steroid use.   started on acyclovir, awaiting HSV  PCRCTH with contrast today and dialysis tomorrowlikley a component of deliriumd/w family at bedside.   Electronic Signatures: Leotis Pain (MD)  (Signed 23-Feb-16 13:53)  Authored: Consult, History of Present Illness, PAST MEDICAL/SURGICAL HISTORY, HOME MEDICATIONS, ALLERGIES, NURSING VITAL SIGNS, Physical Exam-, LAB RESULTS, RADIOLOGY RESULTS, Recommendations   Last Updated: 23-Feb-16 13:53 by Leotis Pain (MD)

## 2015-03-19 NOTE — H&P (Signed)
PATIENT NAME:  Jennifer Roberts, Jennifer Roberts MR#:  [id number] DATE OF BIRTH:  [birth year]  DATE OF ADMISSION:  12/01/2014  PRIMARY CARE PHYSICIAN:  Dr. Juanetta Gosling.    PRIMARY NEPHROLOGIST:  Dr. Wynelle Link.    The patient is a direct admit from Dr. Alice Rieger office for elevated creatinine.   CHIEF COMPLAINT: "My legs are swollen."  HISTORY OF PRESENT ILLNESS:  Jennifer Roberts is a pleasant 81 year old Caucasian female with past medical history of hypothyroidism, hypertension, hyperlipidemia, type 2 diabetes, comes in from Dr. Alice Rieger office with increasing creatinine of 2.1, baseline of 0.84 from May of 2015. She also has noted since December 1 significant increasing lower extremity edema along with decreased urine output. She has been having lethargy and generalized weakness. She is being admitted for acute renal failure and further workup on the same. The patient is scheduled to get a kidney biopsy by interventional radiology.   PAST MEDICAL HISTORY:  1.  Hypertension.  2.  Hyperlipidemia.  3.  Hypothyroidism. TSH according to Dr. Zadie Cleverly laboratories was around 19 according to the patient. She is scheduled to have an appointment with endocrinology at [facility] some time in March.  4.  Type 2 diabetes, on metformin.   PAST SURGICAL HISTORY:  1.  Appendectomy.  2.  Cholecystectomy.  3.  Hemorrhoidectomy.   SOCIAL HISTORY: She is widowed, lives alone, retired Education officer, environmental. She is a former cigarette smoker with a 50 pack-year who quit in 2010. No history of alcohol or any other drug use.  FAMILY HISTORY: Positive for diabetes, cancer involving her mother, and dementia involving her sibling which was her sister.    MEDICATIONS:  1. Amlodipine 2.5 mg p.o. daily.  2. Lasix 20 mg daily.  3. Levothyroxine 100 mcg p.o. daily.  4. Losartan 100 mg every evening.  5. Metformin 500 mg daily.  6. Pravastatin 40 mg every night.   REVIEW OF SYSTEMS:  CONSTITUTIONAL: Positive for fatigue. No fever. Positive for weakness.   EYES: No blurred or double vision, glaucoma, or cataracts.  EARS, NOSE, THROAT: No tinnitus, ear pain, hearing loss.  RESPIRATORY: No cough, wheeze, hemoptysis. Positive for occasional dyspnea on exertion.  CARDIOVASCULAR: No chest pain, orthopnea. Positive for bilateral lower extremity edema. Positive for hypertension.  GASTROINTESTINAL: No nausea, vomiting, diarrhea, abdominal pain, or rectal bleed.  GENITOURINARY: No dysuria, hematuria, frequency.  ENDOCRINE: No polyuria or nocturia. Positive for thyroid problems.  HEMATOLOGY: No anemia or easy bruising.  SKIN: No acne, rash, or lesion.  MUSCULOSKELETAL: Positive for arthritis. No swelling or gout.  NEUROLOGIC: No CVA, TIA, seizure or dementia.  PSYCHIATRIC:  No anxiety or depression.   All other systems reviewed and negative.   PHYSICAL EXAMINATION:  GENERAL: The patient is awake, alert, oriented x 3, not in acute distress.  VITAL SIGNS: She is afebrile. Pulse is 77, blood pressure is 166/82, saturations are 98% on room air.  HEENT: Atraumatic, normocephalic. Pupils PERRLA.  EOM intact. Oral mucosa is moist.  NECK: Supple. No JVD. No carotid bruit.  RESPIRATORY: Decreased breath sounds at the bases. No respiratory distress or labored breathing.  HEART: Both the heart sounds are normal. Rate and rhythm are regular. PMI not lateralized. Chest is nontender.  EXTREMITIES: The patient has 3 + pitting edema both lower extremities. Feeble pedal pulses, good femoral pulses.  NEUROLOGIC: Grossly intact cranial nerves II through XII. No motor or sensory deficit.  PSYCHIATRIC: The patient was awake, alert, oriented x 3.  SKIN: Warm and dry.   LABORATORY DATA:  PT/INR is 11.8 and 0.9. Ultrasound of the kidneys done bilateral appears normal. CBC and metabolic panel are pending. The patient's most recent creatinine is 2.1, her baseline is 0.84 from May 2015.   ASSESSMENT:  Jennifer Roberts is an 81 year old female with hypothyroidism, hypertension,  hyperlipidemia, and type 2 diabetes who comes in with:   1.  Acute renal failure with glucosuria, proteinuria, and hematuria, along with elevated blood pressure and anasarca. Her comorbidities are diabetic nephropathy, hypertensive nephrosclerosis, and possibly of glomerulonephritis and a workup which needs to be done. The patient is going to get renal biopsy. Will do a nephrology consultation with Dr. Cherylann RatelLateef. Further workup according to nephrology recommendations.  2.  Hypertension, not well controlled, with anasarca. Continue current regimen of losartan, amlodipine, and Lasix. Low salt diet. Start salt and fluid restriction.  3.  Type 2 diabetes with renal manifestations on metformin, which I will hold it off given elevated creatinine. We will do sliding scale insulin and possibly consider p.o. glyburide if sugars are uncontrolled.  4.  Hyperlipidemia. Continue losartan for now.  5.  Deep vein thrombosis prophylaxis. Subcutaneous heparin t.i.d. after renal biopsy is completed.   Above was discussed with the patient and the patient's 3 daughters who were present in the room.   TIME SPENT: 50 minutes.   ____________________________ Wylie HailSona A. Allena KatzPatel, MD sap:bu D: 12/01/2014 14:46:34 ET T: 12/01/2014 15:13:40 ET JOB#: [id number]  cc: Ulysse Siemen A. Allena KatzPatel, MD, <Dictator> Dr. Elonda HuskyHawkins Jowan Skillin A Lively Haberman MD ELECTRONICALLY SIGNED 12/02/2014 12:18

## 2015-03-19 NOTE — Consult Note (Signed)
Chief Complaint and History:  Referring Physician Dr. Allena KatzPatel   Chief Complaint Hypothyroidism   Allergies:  Levaquin: Itching, Rash  Floxin: Itching, Rash  Assessment/Plan:  Assessment/Plan 81 yo F seen in consultation for uncontrolled hypothyroidism. TSH level 19 on levothyroxine 100 mcg daily. Family at bedside reports that her TSH level was 91 in 10/2014 and that her PCP, Dr. Juanetta GoslingHawkins, then inc'd her dose of levothyroxine. She does miss a dose of levothyroxine at times. She is taking her morning levothyroxine right before her morning meal.  Her chart was reviewed. She was interviewed and examined.  A/ Uncontrolled hypothyroidism Anasarca Acute on chronic kidney disease  P/ Uncontrolled hypothyroidism since at least 10/2014 and likely prior, which could be contributing to symtoms and her fluide retention. This is not the cause of her acute renal failure. TSH improving, by history. I recommend continuing with current dose of levothyroxine. Will plan to have her TSH rechecked in about 4-6 weeks in my clinic.  Full consult to be dictated.   Electronic Signatures: Raj JanusSolum, Dalton Molesworth M (MD)  (Signed 15-Jan-16 17:08)  Authored: Chief Complaint and History, ALLERGIES, Assessment/Plan   Last Updated: 15-Jan-16 17:08 by Raj JanusSolum, Natalie Mceuen M (MD)

## 2015-03-19 NOTE — Consult Note (Signed)
Brief Consult Note: Diagnosis: AMS, concern for cerebritis, CNS infection.   Patient was seen by consultant.   Consult note dictated.   Recommend to proceed with surgery or procedure.   Recommend further assessment or treatment.   Orders entered.   Discussed with Attending MD.   Comments: Concern for listeria although bcx and CSF cx are negative Would start ampicillin and monitor response pending further wu.  Electronic Signatures: Dierdre HarnessFitzgerald, Kameren Pargas Patrick (MD)  (Signed 25-Feb-16 11:53)  Authored: Brief Consult Note   Last Updated: 25-Feb-16 11:53 by Dierdre HarnessFitzgerald, Kerline Trahan Patrick (MD)

## 2015-03-19 NOTE — Consult Note (Signed)
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 Per the family, she was moving the left side less than the right. Initial evaluation has included brain MRI which was negative for any acute intracranial process and EEG which showed diffuse slowing.   Though she did not ever return to her baseline, the patient clinically deteriorated overnight with reduced responsiveness and, per family, new onset of left facial weakness (MRI was obtained after this episode). She now remains somnolent and poorly interactive. Dialysis is being deferred for now by the nephrology team.  ROS:  Review of Systems   Unable to obtain due to altered mental status.  Past Medical/ Surgical Hx:  Past Medical History ESRD, HTN, borderline DM   Past Surgical History dialysis cath   Home Medications: Medication Instructions Last Modified Date/Time  predniSONE 20 mg oral tablet 3 tab(s) orally once a day 16-Feb-16 15:33  pravastatin 40 mg oral tablet 1 tab(s) orally once a day (at bedtime) 16-Feb-16 15:33  losartan 100 mg oral tablet 1 tab(s) orally once a day evening 16-Feb-16 15:33  calcium acetate 667 mg oral tablet 2 tab(s) orally 3 times a day with meals 16-Feb-16 15:33  Aspirin Low Dose 81 mg oral delayed release tablet 1 tab(s) orally once a day 16-Feb-16 15:33  torsemide 20 mg oral tablet 2 tab(s) orally 2 times a day.  16-Feb-16 15:33  psyllium 1 dose(s) orally once a day 16-Feb-16 15:33  Metoprolol Tartrate 50 mg oral tablet 1 tab(s) orally 2 times a day 16-Feb-16 15:33  Levoxyl 100 mcg (0.1 mg) oral tablet 1 tab(s) orally  once a day 16-Feb-16 15:33  mirtazapine 15 mg oral tablet, disintegrating 1 tab(s) orally once a day (at bedtime) 16-Feb-16 15:33  Maalox Advanced Regular Strength 200 mg-200 mg-20 mg/5 mL oral suspension 15 milliliter(s) orally every 6 hours, As Needed - for Indigestion, Heartburn 16-Feb-16 15:33  acetaminophen 650 mg oral tablet, extended release 1 tab(s) orally every 4 hours, As Needed - for Pain 16-Feb-16 15:33   Allergies:  Levaquin: Itching, Rash  Floxin: Itching, Rash  Social/Family History: Social History: Lives with her daughter in a house. Does not smoke, drink EtOH, or use illicit drugs.  Family History: No FH of stroke or seizure   Vital Signs: **Vital Signs.:   17-Feb-16 12:00  Vital Signs Type Recheck  Pulse Pulse 96  Respirations Respirations 20  Systolic BP Systolic BP 95  Diastolic BP (mmHg) Diastolic BP (mmHg) 74  Mean BP 81  Pulse Ox % Pulse Ox % 94  Oxygen Delivery Room Air/ 21 %   EXAM: Somnolent and poorly interactive; ill-appearing. No conjunctival injection or scleral edema. Oropharynx clear. No carotid bruits auscultated. Normal S1, S2 and regular cardiac rhythm on exam. Lungs notable for mild bibasilar crackles. Abdomen soft and nontender. Peripheral pulses palpated. 3+ pitting edema to the shins bilaterally.  MENTAL STATUS: Lethargic, opens eyes to pain. Follows simple command to protrude tongue, though no other commands. Does not answer questions.  CRANIAL NERVES: VF full to blink-to-threat. PERRL. EOMI with oculocephalic maneuvers. Facial muscles full and symmetric. Corneal reflexes intact. MOTOR: Normal bulk and tone. She  moves all four limbs against gravity in response to pain REFLEXES: absent in biceps, triceps, patella, and achilles bilaterally. Equivocal plantar responses bilaterally. SENSORY: Grimaces to pain in all four limbs. COORDINATION/GAIT: Unable to assess.  Lab Results: Thyroid:  16-Feb-16 13:13   Thyroid Stimulating Hormone 1.48  (0.45-4.50 (IU = International Unit)  ----------------------- Pregnant patients have  different reference  ranges for TSH:  - - - - - - - - - -  Pregnant, first trimetser:  0.36 - 2.50 uIU/mL)  Hepatic:  17-Feb-16 00:31   Bilirubin, Total 0.4  Alkaline Phosphatase 100  SGPT (ALT) 49  SGOT (AST) 35  Total Protein, Serum  4.0  Albumin, Serum  1.7  Routine Micro:  16-Feb-16 15:16   Micro Text Report BLOOD CULTURE   COMMENT                   NO GROWTH IN 8-12 HOURS   ANTIBIOTIC                       Culture Comment NO GROWTH IN 8-12 HOURS  Result(s) reported on 03 Jan 2015 at 11:00PM.  Routine Chem:  16-Feb-16 13:13   Magnesium, Serum  1.7 (1.8-2.4 THERAPEUTIC RANGE: 4-7 mg/dL TOXIC: > 10 mg/dL  -----------------------)  17-Feb-16 00:31   Result Comment TROPONIN - RESULTS VERIFIED BY REPEAT TESTING.  - PREV. C/ 01-03-15 _0 .Marland KitchenMarland KitchenAJO  Result(s) reported on 04 Jan 2015 at 01:17AM.  Glucose, Serum  170  BUN  56  Creatinine (comp)  1.40  Sodium, Serum 143  Potassium, Serum  3.0  Chloride, Serum 99  CO2, Serum  37  Calcium (Total), Serum 8.7  Osmolality (calc) 304  eGFR (African American)  47  eGFR (Non-African American)  38 (eGFR values <64m/min/1.73 m2 may be an indication of chronic kidney disease (CKD). Calculated eGFR, using the MRDR Study equation, is useful in  patients with stable renal function. The eGFR calculation will not be reliable in acutely ill patients when serum creatinine is changing rapidly. It is not useful in patients on dialysis. The eGFR calculation may not be applicable to patients at the low and high extremes of body sizes, pregnant women, and vegetarians.)  Anion Gap 7  Cardiac:  17-Feb-16 00:31   Troponin I  0.20 (0.00-0.05 0.05 ng/mL or less: NEGATIVE  Repeat testing in 3-6 hrs  if clinically indicated. >0.05 ng/mL: POTENTIAL  MYOCARDIAL INJURY. Repeat  testing in 3-6 hrs if  clinically indicated. NOTE: An increase or decrease  of  30% or more on serial  testing suggests a  clinically important change)  Routine UA:  16-Feb-16 13:51   Color (UA) Yellow  Clarity (UA) Clear  Glucose (UA) >=500  Bilirubin (UA) Negative  Ketones (UA) Negative  Specific Gravity (UA) 1.010  Blood (UA) Negative  pH (UA) 7.0  Protein (UA) >=500  Nitrite (UA) Negative  Leukocyte Esterase (UA) Negative (Result(s) reported on 03 Jan 2015 at 02:34PM.)  RBC (UA) 1 /HPF  WBC (UA) 2 /HPF  Bacteria (UA) NONE SEEN  Epithelial Cells (UA) 1 /HPF  Hyaline Cast (UA) 10 /LPF (Result(s) reported on 03 Jan 2015 at 02:34PM.)  Routine Coag:  16-Feb-16 13:13   Prothrombin 12.4 (11.4-15.0 NOTE: New Reference Range  12/16/14)  INR 0.9 (INR reference interval applies to patients on anticoagulant therapy. A single INR therapeutic range for coumarins is not optimal for all indications; however, the suggested range for most indications is 2.0 - 3.0. Exceptions to the  INR Reference Range may include: Prosthetic heart valves, acute myocardial infarction, prevention of myocardial infarction, and combinations of aspirin and anticoagulant. The need for a higher or lower target INR must be assessed individually. Reference: The Pharmacology and Management of the Vitamin K  antagonists: the seventh ACCP Conference on Antithrombotic and Thrombolytic Therapy. BCWUG.8916 Sept:126 (3suppl): [id number]. A HCT value >55% may artifactually increase the PT.  In one study,  the increase was an average of 25%. Reference:  "Effect on Routine and Special Coagulation Testing Values of Citrate Anticoagulant Adjustment in Patients with High HCT Values." American Journal of Clinical Pathology 2006;126:400-405.)  Activated PTT (APTT)  65.0 (A HCT value >55% may artifactually increase the APTT. In one study, the increase was an average of 19%. Reference: "Effect on Routine and Special Coagulation Testing Values of Citrate Anticoagulant Adjustment in Patients with High HCT  Values." American Journal of Clinical Pathology 2006;126:400-405.)  Routine Hem:  17-Feb-16 00:31   WBC (CBC)  26.0  RBC (CBC)  3.16  Hemoglobin (CBC)  9.2  Hematocrit (CBC)  27.9  Platelet Count (CBC) 205  MCV 88  MCH 29.0  MCHC 32.8  RDW 13.9  Neutrophil % 90.6  Lymphocyte % 3.4  Monocyte % 5.2  Eosinophil % 0.0  Basophil % 0.8  Neutrophil #  23.6  Lymphocyte #  0.9  Monocyte #  1.3  Eosinophil # 0.0  Basophil #  0.2 (Result(s) reported on 04 Jan 2015 at 12:54AM.)   Radiology Results: MRI:    17-Feb-16 10:12, MRI Brain Without Contrast  MRI Brain Without Contrast   REASON FOR EXAM:    cva. Left weakness  COMMENTS:       PROCEDURE: MR  - MR BRAIN WO CONTRAST  - Jan 04 2015 10:12AM     CLINICAL DATA:  Altered mental status. Seizure with subsequent left  facial droop.    EXAM:  MRI HEAD WITHOUT CONTRAST    TECHNIQUE:  Multiplanar, multiecho pulse sequences of the brain and surrounding  structures were obtained without intravenous contrast.  COMPARISON:  CT head without contrast 01/03/2015. MRI brain  04/03/2007.    FINDINGS:  The diffusion-weighted images demonstrate no evidence for acute or  subacute infarction. Atrophy and white matter disease is evident  bilaterally. No acute hemorrhage or mass lesion is present. The  ventricles are proportionate to the degree of atrophy. Flow is  present in the major intracranial arteries.    The globes and orbits are intact. Midline structures are within  normal limits. The paranasal sinuses and mastoid air cells are  clear.     IMPRESSION:  1. Moderate atrophy and white matter disease. This likely reflects  the sequela of chronic microvascular ischemia.  2. No acute intracranial abnormality or focal lesion to explain the  patient's facial droop or altered mental status.      Electronically Signed    By: San Morelle M.D.    On: 01/04/2015 10:20         Verified By: Resa Miner. MATTERN, M.D.,  CT:     16-Feb-16 13:34, CT Head Without Contrast  CT Head Without Contrast   REASON FOR EXAM:    confusion with syncope  COMMENTS:   May transport without cardiac monitor    PROCEDURE: CT  - CT HEAD WITHOUT CONTRAST  - Jan 03 2015  1:34PM     CLINICAL DATA:  Confusion with syncope    EXAM:  CT HEAD WITHOUT CONTRAST    TECHNIQUE:  Contiguous  axial images were obtained from the base of the skull  through the vertex without intravenous contrast.    COMPARISON:  MRI brain 04/03/2007  FINDINGS:  There is no evidence of mass effect, midline shift, or extra-axial  fluid collections. There is no evidence of a space-occupying lesion  or intracranial hemorrhage. There is no evidence of a cortical-based  area of acute infarction. There is generalized cerebral atrophy.  There is periventricular white matter low attenuation likely  secondary to microangiopathy.    The ventricles and sulci are appropriate for the patient's age. The  basal cisterns are patent.    Visualized portions of the orbits are unremarkable. The visualized  portions of the paranasal sinuses and mastoid aircells are  unremarkable. Cerebrovascular atherosclerotic calcifications are  noted.  The osseous structures are unremarkable.     IMPRESSION:  1. No acute intracranial pathology.  2. Chronic microvascular disease and cerebral atrophy.      Electronically Signed    By: Kathreen Devoid    On: 01/03/2015 14:10         Verified By: Jennette Banker, M.D., MD   Impression/Recommendations: Recommendations:   Jennifer Roberts is an 81 yo woman with ESRD on HD who presented with sudden deterioration in mental status in the setting of dialysis. EEG showed diffuse slowing consistent with toxic or metabolic encephalopathy; brain MRI showed diffuse cerebral atrophy but no acute intracranial process. Imaging and clinical exam show no focal or lateralizing deficits. I favor ongoing toxic/metabolic encephalopathy and/or dialysis  dysequilibrium syndrome as the cause of her persistent altered mental status. repeat EEG to increase sensitivity for epileptiform discharges and rule out subclinical seizures (exam has changed significantly since last EEG was performed)continue supportive care and management of infectious and metabolic problemsno antiepileptic therapy at this time until/unless warranted by epileptiform EEG findingsprognosis is unclear but she is expected to improve if this is toxic/metabolic encephalopathy due to reversible systemic perturbations you for the opportunity to participate in Jennifer Roberts's care. Please page Neurology with further questions. Mar Daring, MD   Electronic Signatures: Carmin Richmond (MD)  (Signed 17-Feb-16 13:10)  Authored: REFERRING PHYSICIAN, Consult, History of Present Illness, Review of Systems, PAST MEDICAL/SURGICAL HISTORY, HOME MEDICATIONS, ALLERGIES, Social/Family History, NURSING VITAL SIGNS, Physical Exam-, LAB RESULTS, RADIOLOGY RESULTS, Recommendations   Last Updated: 17-Feb-16 13:10 by Carmin Richmond (MD)

## 2015-03-19 NOTE — Consult Note (Signed)
   Comments   Jeneen Rinks, NP, and I met with pt's 3 daughters. Reviewed in detail the options for d/c which are limited. Pt's insurance does not cover LTACH. Family do not want pt to go out of state ([place]) for stretcher dialysis. Pt cannot sit up for outpt dialysis so SNF for STR is not an option at this time. If family chose to stop dialysis, pt could go to the [facility]. Family understand all options and will discuss among themselves and with brother. Will follow up tomorrow.   Electronic Signatures: Thi Klich, Izora Gala (MD)  (Signed 29-Feb-16 15:47)  Authored: Palliative Care   Last Updated: 29-Feb-16 15:47 by Sadat Sliwa, Izora Gala (MD)

## 2015-03-19 NOTE — Discharge Summary (Signed)
PATIENT NAME:  Jennifer Roberts, [name] MR#:  [id number] DATE OF BIRTH:  1932-08-28  DATE OF ADMISSION:  01/03/2015 DATE OF DISCHARGE:  01/18/2015  ADMITTING PHYSICIAN: Srikar R. Sudini, MD  DISCHARGING PHYSICIAN: Enid Baasadhika Shariyah Eland, MD   PRIMARY CARE PHYSICIAN: Janeann ForehandJames H. Hawkins Jr., MD  PRIMARY NEPHROLOGIST: Laverda SorensonSarath C. Kolluru, MD  CONSULTATIONS IN THE HOSPITAL:  1. Nephrology consultation by Dr. Irish LackMansour and Dr. Thedore MinsSingh.  2. Neurology consultation by Dr. Loretha BrasilZeylikman.   3. Cardiology consultation by Dr. Kirke CorinArida.  4. Palliative care consultation by Dr. Harriett SineNancy Phifer.  5. Infectious disease consultation by Dr. Sampson GoonFitzgerald.   DISCHARGE DIAGNOSES:  1. Acute encephalopathy.  2. Acute bilateral cerebrovascular accident. 3. Acute renal failure secondary to minimal-change disease, requiring hemodialysis as needed.  4. Allergic reaction to AMPICILLIN.  5. Hypertension.  6. Diabetes mellitus.  7. Anemia of chronic disease.  8. Early dementia.  9. Hypothyroidism.   DISCHARGE HOME MEDICATIONS:   1. Pravastatin 40 mg p.o. daily.  2. Losartan 100 mg p.o. daily.  3. PhosLo 667 mg 2 tablets 3 times a day with meals.  4. Aspirin 81 mg p.o. daily.  5. Torsemide 40 mg p.o. daily.  6. Psyllium 1 capsule orally daily.  7. Metoprolol 50 mg p.o. b.i.d.  8. Levoxyl 100 mcg p.o. daily.  9. Remeron 15 mg p.o. at bedtime.  10. Maalox 15 mL every 6 hours p.r.n. for indigestion.  11. Tylenol 650 mg every 4 hours p.r.n. for pain.  12. Prednisone 60 mg p.o. daily.  13. Glipizide 5 mg p.o. daily in the morning.  14. Megace 400 mg p.o. daily.  15. Potassium chloride 20 mEq p.o. daily while on torsemide.   DISCHARGE DIET: Low-sodium diet.   DISCHARGE ACTIVITY: As tolerated.    FOLLOWUP INSTRUCTIONS:  1. That basic metabolic panel check in 5 days.  2. Follow up with Dr. Wynelle LinkKolluru in 5 days.  3. PCP followup in 1 to 2 weeks.  4. Physical therapy.   LABORATORIES AND IMAGING STUDIES PRIOR TO DISCHARGE: Sodium 142,  potassium 3.0, chloride 102, bicarbonate 34, BUN 41, creatinine 1.6, glucose 200, and calcium of 7.9.   WBC 15.9, hemoglobin 7.9, hematocrit 24.4, platelet count 179,000.   CT head on 01/15/2015 showing atrophy, chronic small vessel ischemic changes. No acute intracranial process.  HbA1c is 5.5.   CT head and neck angio on 01/12/2015 showing edema in bilateral parietal lobes, right greater than left, improving since the MRI was done, consistent with acute stroke. A 50% stenosis of proximal internal carotid artery bilaterally due to calcified plaque. Significant irregularities ` stenosis throughout the middle cerebral artery branches right greater than left noted. Decreased flow and perfusion of the right parietal lobe, which may be due to re-cannulized vessel related to patient's acute infarct.   MRI of the brain without contrast showing worsening of cerebral edema compared to prior MRI, bilateral MCA territory infarcts, right greater than left noted. Cytotoxic edema present. Small vessel disease noted.   BRIEF HOSPITAL COURSE: For more details, please look at the history and physical dictated by Dr. Elpidio AnisSudini on 01/03/2015. Look at the interim summary dictated by Dr. Elby Showersatherine Walsh on 01/08/2015. In brief, Ms. Jennifer Roberts is an 81 year old Caucasian female with a past medical history significant for hypertension, diabetes and early dementia, who presented recently to the hospital for acute renal failure, diagnosed to have minimal-change disease, started on steroids, progressed to end-stage renal disease, requiring hemodialysis. [facility]. She was brought back secondary to  confusion and also syncope when dialysis was restarted.   1. Acute encephalopathy, initially thought to be dialysis disequilibrium syndrome; however, the patient has not been on dialysis for that long to develop that condition. She had notable hypotension during dialysis, as reported by the nephrologist. Initial  CT, MRI and EEGs negative for any seizure or acute stroke. However, the patient's mental status continued to get worse. She was noted that she probably has decreased vision, though she was not able to cooperate for exam. She has remained confused and she is noted to have left-sided weakness more than the right side. MRI was repeated along with a CT angio of the head and neck. It  showed she does have significant bilateral MCA infarction, right greater than left with significant cytotoxic edema. The edema seemed to be improving. She started back on aspirin. She is already on statin. At this point, she had she is able to move all 4 extremities, needs intense therapy; however, her mental status, she is only oriented to self at times. She is completely confused. Palliative care has been following the patient and family discussed multiple options about hospice home versus rehabilitation. Since the patient is able to participate with therapy and eating at this point, they wanted to send her to rehabilitation and see. So, the patient will be going to short-term rehabilitation again.  2. Acute renal failure progressed to end-stage renal disease due to minimal change disease. Continue her prednisone 60 mg daily. She was restarted on dialysis on admission and had dialysis up until 01/13/2015. Over the last 4 days, the patient has not had any dialysis. She has been doing fine. So, at this point, nephrology said she will not need dialysis. However, she needs to be followed up on again to see if she needs any as-needed dialysis. She is on torsemide b.i.d. and her potassium has remained low in the hospital, so she is being discharged on a low-dose potassium supplementation. However, will need to have a BMP check in 4 to 5 days and follow up with Dr. Wynelle Link.  3. Anemia of chronic disease, transfusion in the hospital. Hemoglobin stayed stable around 8. Continue to follow up as an outpatient and see if she would be a candidate  for Epogen as an outpatient.  4. Diabetes mellitus. Glipizide has been started.  5. Poor appetite, failure to thrive improved with Megace.  6. Leukocytosis likely from being on steroids.  7. Hypertension. Losartan and metoprolol and torsemide at this point.   CODE STATUS: Full code.   I appreciate all of the consultants' help in the patient's care.   DISCHARGE CONDITION: Guarded.   DISCHARGE DISPOSITION: To short-term rehab.   TIME SPENT ON DISCHARGE: 45 minutes.   ____________________________ Enid Baas, MD rk:ap D: 01/18/2015 14:48:33 ET T: 01/18/2015 15:06:55 ET JOB#: [id number]  cc: Enid Baas, MD, <Dictator> Laverda Sorenson, MD Janeann Forehand., MD Enid Baas MD ELECTRONICALLY SIGNED 02/02/2015 18:02

## 2015-03-19 NOTE — Consult Note (Signed)
Chief Complaint:  Subjective/Chief Complaint No emergent events from a GU standpoint overnight. Continues no void without difficulty nor hematuria   VITAL SIGNS/ANCILLARY NOTES: **Vital Signs.:   24-Jan-16 07:48  Vital Signs Type Routine  Temperature Temperature (F) 97.8  Celsius 36.5  Pulse Pulse 60  Respirations Respirations 18  Systolic BP Systolic BP 235  Diastolic BP (mmHg) Diastolic BP (mmHg) 76  Mean BP 109  Pulse Ox % Pulse Ox % 93  Pulse Ox Activity Level  At rest  Oxygen Delivery Room Air/ 21 %  *Intake and Output.:   24-Jan-16 01:15  Urinary Method  Up to BR; missing hat per NT    07:15  Urine ml     Out:  50  Urinary Method  Up to BR    Shift 15:00  Urine ml     Out:  50   Brief Assessment:  GEN no acute distress   Respiratory normal resp effort  no use of accessory muscles   Gastrointestinal details normal Soft  Nontender  Nondistended   Lab Results: Routine Chem:  24-Jan-16 07:37   Glucose, Serum 99  BUN  34  Creatinine (comp)  3.80  Sodium, Serum 137  Potassium, Serum 3.7  Chloride, Serum 100  CO2, Serum 30  Calcium (Total), Serum  7.3  Anion Gap 7  Osmolality (calc) 281  eGFR (African American)  15  eGFR (Non-African American)  12 (eGFR values <60m/min/1.73 m2 may be an indication of chronic kidney disease (CKD). Calculated eGFR, using the MRDR Study equation, is useful in  patients with stable renal function. The eGFR calculation will not be reliable in acutely ill patients when serum creatinine is changing rapidly. It is not useful in patients on dialysis. The eGFR calculation may not be applicable to patients at the low and high extremes of body sizes, pregnant women, and vegetarians.)  Routine Hem:  24-Jan-16 07:37   Hemoglobin (CBC)  10.2 (Result(s) reported on 11 Dec 2014 at 08:12AM.)   Assessment/Plan:  Assessment/Plan:  Assessment Hematuria in the setting of recent renal biopsy for workup of renal failure. Hematuria has  resolved   Plan 1. Continue care per primary service 2. Will need outpatient cystourethroscopy for workup of hematuria to rule out ower urinary tract etiologies. 3. f/u with [facility] in 2-4 weeks   Electronic Signatures: LTera Mater(MD)  (Signed 24-Jan-16 10:17)  Authored: Chief Complaint, VITAL SIGNS/ANCILLARY NOTES, Brief Assessment, Lab Results, Assessment/Plan   Last Updated: 24-Jan-16 10:17 by LTera Mater(MD)

## 2015-03-19 NOTE — Consult Note (Signed)
CHIEF COMPLAINT and HISTORY:  Subjective/Chief Complaint renal failure   History of Present Illness Patient is admitted with worsening of known CKD and now progressed to ESRD.  She has complained of SOB and leg swelling that has progressed over weeks and months.  Laying flat makes her breathing worse.  Activity causes SOB with minimal activity.  She has HTN.  Her renal failure has been slowly progressive, and she was admitted from her nephrologists office last week.  Studies have confirmed she is now ESRD.   PAST MEDICAL/SURGICAL HISTORY:  Past Medical History:   denies:   ALLERGIES:  Allergies:  Levaquin: Itching, Rash  Floxin: Itching, Rash  HOME MEDICATIONS:  Home Medications: Medication Instructions Status  levothyroxine 100 mcg (0.1 mg) oral capsule 1 cap(s) orally once a day Active  Fiber Therapy 1   once a day Active  Lasix 20 mg oral tablet 1 tab(s) orally once a day Active  pravastatin 40 mg oral tablet 1 tab(s) orally once a day (at bedtime) Active  amLODIPine 5 mg oral tablet 1 tab(s) orally once a day evening Active  losartan 100 mg oral tablet 1 tab(s) orally once a day evening Active   Family and Social History:  Family History Coronary Artery Disease  Hypertension  Diabetes Mellitus   Social History positive tobacco (Greater than 1 year), negative ETOH, negative Illicit drugs   Place of Living Home   Review of Systems:  Subjective/Chief Complaint No TIA/stroke/seizure No heat or cold intolerance No dysuria/hematuria No blurry or double vision No tinnitus or ear pain No rashes or ulcer No suicidal ideation or psychosis No signs of bleeding or easy bruising Positive for SOB with exertion No palpitations or chest pain No N/V/D or abdominal pain No joint pain or joint swelling No fever or chills Positive for weight gain   Physical Exam:  GEN well developed, well nourished, no acute distress   HEENT pink conjunctivae, hearing intact to voice   NECK  No masses  trachea midline   RESP normal resp effort  no use of accessory muscles   CARD regular rate  no JVD   VASCULAR ACCESS none yet   ABD denies tenderness  soft  normal BS   GU no superpubic tenderness   LYMPH negative neck, negative axillae   EXTR negative cyanosis/clubbing, positive edema   SKIN No rashes, No ulcers   NEURO cranial nerves intact, follows commands, motor/sensory function intact   PSYCH alert, A+O to time, place, person, good insight   LABS:  Laboratory Results: Thyroid:    14-Jan-16 14:07, Thyroid Stimulating Hormone  Thyroid Stimulating Hormone 19.5  0.45-4.50  (IU = International Unit)   -----------------------  Pregnant patients have   different reference   ranges for TSH:   - - - - - - - - - -   Pregnant, first trimetser:   0.36 - 2.50 uIU/mL  LabObservation:    19-Jan-16 14:36, [place] Color Flow Doppler Upper Extrem Left (Arm)  OBSERVATION   Reason for Test Swelling  Hepatic:    20-Jan-16 05:23, Comprehensive Metabolic Panel  Bilirubin, Total 0.2  Alkaline Phosphatase 66  46-116  NOTE: New Reference Range  06/07/14  SGPT (ALT) 18  14-63  NOTE: New Reference Range  06/07/14  SGOT (AST) 25  Total Protein, Serum 4.6  Albumin, Serum 1.5  Routine BB:    14-Jan-16 15:04, Type and Antibody Screen  ABO Group + Rh Type   O Positive  Antibody Screen NEGATIVE  Result(s) reported  on 01 Dec 2014 at 04:28PM.  Routine Chem:    14-Jan-16 91:69, Basic Metabolic Panel (w/Total Calcium)  Glucose, Serum 105  BUN 54  Creatinine (comp) 4.56  Sodium, Serum 143  Potassium, Serum 3.5  Chloride, Serum 108  CO2, Serum 25  Calcium (Total), Serum 7.9  Anion Gap 10  Osmolality (calc) 300  eGFR (African American) 12  eGFR (Non-African American) 10  eGFR values <2m/min/1.73 m2 may be an indication of chronic  kidney disease (CKD).  Calculated eGFR, using the MRDR Study equation, is useful in   patients with stable renal function.  The eGFR  calculation will not be reliable in acutely ill patients  when serum creatinine is changing rapidly. It is not useful in  patients on dialysis. The eGFR calculation may not be applicable  to patients at the low and high extremes of body sizes, pregnant  women, and vegetarians.    15-Jan-16 045:03 Basic Metabolic Panel (w/Total Calcium)  Glucose, Serum 78  BUN 53  Creatinine (comp) 4.44  Sodium, Serum 144  Potassium, Serum 3.5  Chloride, Serum 111  CO2, Serum 24  Calcium (Total), Serum 7.1  Anion Gap 9  Osmolality (calc) 300  eGFR (African American) 12  eGFR (Non-African American) 10  eGFR values <668mmin/1.73 m2 may be an indication of chronic  kidney disease (CKD).  Calculated eGFR, using the MRDR Study equation, is useful in   patients with stable renal function.  The eGFR calculation will not be reliable in acutely ill patients  when serum creatinine is changing rapidly. It is not useful in  patients on dialysis. The eGFR calculation may not be applicable  to patients at the low and high extremes of body sizes, pregnant  women, and vegetarians.    16-Jan-16 1088:82Basic Metabolic Panel (w/Total Calcium)  Glucose, Serum 232  BUN 61  Creatinine (comp) 5.03  Sodium, Serum 140  Potassium, Serum 3.8  Chloride, Serum 105  CO2, Serum 23  Calcium (Total), Serum 7.5  Anion Gap 12  Osmolality (calc) 304  eGFR (African American) 11  eGFR (Non-African American) 9  eGFR values <6047min/1.73 m2 may be an indication of chronic  kidney disease (CKD).  Calculated eGFR, using the MRDR Study equation, is useful in   patients with stable renal function.  The eGFR calculation will not be reliable in acutely ill patients  when serum creatinine is changing rapidly. It is not useful in  patients on dialysis. The eGFR calculation may not be applicable  to patients at the low and high extremes of body sizes, pregnant  women, and vegetarians.    17-Jan-16 03:80:03asic Metabolic Panel  (w/Total Calcium)  Glucose, Serum 133  BUN 67  Creatinine (comp) 5.38  Sodium, Serum 139  Potassium, Serum 3.9  Chloride, Serum 105  CO2, Serum 23  Calcium (Total), Serum 7.2  Anion Gap 11  Osmolality (calc) 299  eGFR (African American) 10  eGFR (Non-African American) 8  eGFR values <27m68mn/1.73 m2 may be an indication of chronic  kidney disease (CKD).  Calculated eGFR, using the MRDR Study equation, is useful in   patients with stable renal function.  The eGFR calculation will not be reliable in acutely ill patients  when serum creatinine is changing rapidly. It is not useful in  patients on dialysis. The eGFR calculation may not be applicable  to patients at the low and high extremes of body sizes, pregnant  women, and vegetarians.    18-Jan-16 03:049:17sic Metabolic Panel (w/Total  Calcium)  Glucose, Serum 154  BUN 79  Creatinine (comp) 5.61  Sodium, Serum 137  Potassium, Serum 4.1  Chloride, Serum 104  CO2, Serum 21  Calcium (Total), Serum 7.3  Anion Gap 12  Osmolality (calc) 301  eGFR (African American) 9  eGFR (Non-African American) 8  eGFR values <68m/min/1.73 m2 may be an indication of chronic  kidney disease (CKD).  Calculated eGFR, using the MRDR Study equation, is useful in   patients with stable renal function.  The eGFR calculation will not be reliable in acutely ill patients  when serum creatinine is changing rapidly. It is not useful in  patients on dialysis. The eGFR calculation may not be applicable  to patients at the low and high extremes of body sizes, pregnant  women, and vegetarians.    19-Jan-16 088:89 Basic Metabolic Panel (w/Total Calcium)  Glucose, Serum 81  BUN 87  Creatinine (comp) 5.39  Sodium, Serum 135  Potassium, Serum 4.4  Chloride, Serum 103  CO2, Serum 21  Calcium (Total), Serum 6.7  Anion Gap 11  Osmolality (calc) 296  eGFR (African American) 10  eGFR (Non-African American) 8  eGFR values <619mmin/1.73 m2 may be an  indication of chronic  kidney disease (CKD).  Calculated eGFR, using the MRDR Study equation, is useful in   patients with stable renal function.  The eGFR calculation will not be reliable in acutely ill patients  when serum creatinine is changing rapidly. It is not useful in  patients on dialysis. The eGFR calculation may not be applicable  to patients at the low and high extremes of body sizes, pregnant  women, and vegetarians.  Result Comment   Calcium - RESULTS VERIFIED BY REPEAT TESTING.   - Critical value called to and read back   - by BeIrine Sealn 1A @ [phone]/19/16.   - BGB   Result(s) reported on 06 Dec 2014 at 06:10AM.    20-Jan-16 05:23, Comprehensive Metabolic Panel  Glucose, Serum 71  BUN 90  Creatinine (comp) 5.60  Sodium, Serum 134  Potassium, Serum 4.5  Chloride, Serum 103  CO2, Serum 20  Calcium (Total), Serum 7.2  Osmolality (calc) 294  eGFR (African American) 9  eGFR (Non-African American) 8  eGFR values <6016min/1.73 m2 may be an indication of chronic  kidney disease (CKD).  Calculated eGFR, using the MRDR Study equation, is useful in   patients with stable renal function.  The eGFR calculation will not be reliable in acutely ill patients  when serum creatinine is changing rapidly. It is not useful in  patients on dialysis. The eGFR calculation may not be applicable  to patients at the low and high extremes of body sizes, pregnant  women, and vegetarians.  Anion Gap 11  Misc Urine Chem:    15-Jan-16 16:50, Protein/Creatinine Ratio, Ur Random  Creatinine, Urine 117.6  Protein, Random Urine 1149  Protein/Creat Ratio (comp) 9770  Result(s) reported on 02 Dec 2014 at 06:16PM.  Routine UA:    15-Jan-16 16:50, Urinalysis  Color (UA) Yellow  Clarity (UA) Cloudy  Glucose (UA) >=500  Bilirubin (UA) Negative  Ketones (UA) Negative  Specific Gravity (UA) 1.014  Blood (UA) 2+  pH (UA) 5.0  Protein (UA) >=500  Nitrite (UA) Negative  Leukocyte Esterase  (UA) Negative  Result(s) reported on 02 Dec 2014 at 05:15PM.  RBC (UA) 17 /HPF  WBC (UA) 6 /HPF  Bacteria (UA) 1+  Epithelial Cells (UA) 4 /HPF  Mucous (UA) PRESENT  Result(s) reported  on 02 Dec 2014 at 05:15PM.  Routine Coag:    14-Jan-16 14:07, Prothrombin Time  Prothrombin 11.8  INR 0.9  INR reference interval applies to patients on anticoagulant therapy.  A single INR therapeutic range for coumarins is not optimal for all  indications; however, the suggested range for most indications is  2.0 - 3.0.  Exceptions to the INR Reference Range may include: Prosthetic heart  valves, acute myocardial infarction, prevention of myocardial  infarction, and combinations of aspirin and anticoagulant. The need  for a higher or lower target INR must be assessed individually.  Reference: The Pharmacology and Management of the Vitamin K   antagonists: the seventh ACCP Conference on Antithrombotic and  Thrombolytic Therapy. BHALP.3790 Sept:126 (3suppl): [id number].  A HCT value >55% may artifactually increase the PT.  In one study,   the increase was an average of 25%.  Reference:  "Effect on Routine and Special Coagulation Testing Values  of Citrate Anticoagulant Adjustment in Patients with High HCT Values."  American Journal of Clinical Pathology 2006;126:400-405.  Routine Hem:    14-Jan-16 14:07, CBC Profile  WBC (CBC) 6.4  RBC (CBC) 3.97  Hemoglobin (CBC) 11.6  Hematocrit (CBC) 35.4  Platelet Count (CBC) 250  MCV 89  MCH 29.3  MCHC 32.9  RDW 13.7  Neutrophil % 69.9  Lymphocyte % 20.1  Monocyte % 7.0  Eosinophil % 2.5  Basophil % 0.5  Neutrophil # 4.5  Lymphocyte # 1.3  Monocyte # 0.5  Eosinophil # 0.2  Basophil # 0.0  Result(s) reported on 01 Dec 2014 at 03:08PM.    16-Jan-16 10:42, CBC Profile  WBC (CBC) 10.9  RBC (CBC) 3.73  Hemoglobin (CBC) 11.0  Hematocrit (CBC) 33.4  Platelet Count (CBC) 297  MCV 90  MCH 29.6  MCHC 33.0  RDW 13.8  Neutrophil % 94.0  Lymphocyte %  4.6  Monocyte % 1.3  Eosinophil % 0.0  Basophil % 0.1  Neutrophil # 10.3  Lymphocyte # 0.5  Monocyte # 0.1  Eosinophil # 0.0  Basophil # 0.0  Result(s) reported on 03 Dec 2014 at 10:59AM.    17-Jan-16 03:25, CBC Profile  WBC (CBC) 16.1  RBC (CBC) 3.07  Hemoglobin (CBC) 8.9  Hematocrit (CBC) 27.1  Platelet Count (CBC) 260  MCV 88  MCH 28.9  MCHC 32.7  RDW 14.0  Neutrophil % 92.2  Lymphocyte % 4.1  Monocyte % 3.6  Eosinophil % 0.0  Basophil % 0.1  Neutrophil # 14.9  Lymphocyte # 0.7  Monocyte # 0.6  Eosinophil # 0.0  Basophil # 0.0  Result(s) reported on 04 Dec 2014 at 04:37AM.    18-Jan-16 03:06, CBC Profile  WBC (CBC) 16.3  RBC (CBC) 3.20  Hemoglobin (CBC) 9.4  Hematocrit (CBC) 28.2  Platelet Count (CBC) 273  MCV 88  MCH 29.3  MCHC 33.2  RDW 13.8  Neutrophil % 93.5  Lymphocyte % 2.7  Monocyte % 3.7  Eosinophil % 0.0  Basophil % 0.1  Neutrophil # 15.2  Lymphocyte # 0.4  Monocyte # 0.6  Eosinophil # 0.0  Basophil # 0.0  Result(s) reported on 05 Dec 2014 at 04:01AM.    19-Jan-16 04:55, CBC Profile  WBC (CBC) 15.7  RBC (CBC) 3.22  Hemoglobin (CBC) 9.3  Hematocrit (CBC) 28.8  Platelet Count (CBC) 270  MCV 89  MCH 29.0  MCHC 32.5  RDW 13.8  Neutrophil % 84.2  Lymphocyte % 7.7  Monocyte % 8.0  Eosinophil % 0.0  Basophil % 0.1  Neutrophil # 13.2  Lymphocyte # 1.2  Monocyte # 1.3  Eosinophil # 0.0  Basophil # 0.0  Result(s) reported on 06 Dec 2014 at 06:10AM.   RADIOLOGY:  Radiology Results: [place]:    13-Jan-16 11:03, US Kidney Bilateral  US Kidney Bilateral  REASON FOR EXAM:    CKD Stage 3  COMMENTS:       PROCEDURE: Eartha Inch KIDNEYS  - Nov 30 2014 11:03AM     CLINICAL DATA:  Chronic stage 3 kidney disease.    EXAM:  RENAL/URINARY TRACT ULTRASOUND COMPLETE    COMPARISON:  04/10/2007    FINDINGS:  Right Kidney:  Length: 11.9 cm. Normal renal cortical thickness and echogenicity  without focal lesions or hydronephrosis.    Left  Kidney:    Length: 10.5 cm. Normal renal cortical thickness and echogenicity  without focal lesions or hydronephrosis..    Bladder:    Appears normal for degree of bladder distention.     IMPRESSION:  Normal renal ultrasound examination.  Electronically Signed    By: Kalman Jewels M.D.    On: 11/30/2014 12:20         Verified By: Marlane Hatcher, M.D.,    14-Jan-16 16:35, US Guide Needle Biopsy Other Than Breast  US Guide Needle Biopsy Other Than Breast  REASON FOR EXAM:    acute renal failure, proteinuria, nephrotic syndrome  COMMENTS:       PROCEDURE: [place]  - US GUIDED BX/ASPIRATION NOT BR  - Dec 01 2014  4:35PM     CLINICAL DATA:  81 year old female with acute renal failure,  proteinuria and nephrotic syndrome. Urgent ultrasound-guided core  biopsy of the kidney is requested.    EXAM:  ULTRASOUND CORE BIOPSY    Date: 12/01/2014    PROCEDURE:  1. Ultrasound-guided random biopsy of the lower pole of the left  renal cortex  Interventional Radiologist:  Criselda Peaches, MD    ANESTHESIA/SEDATION:  Moderate (conscious) sedation was used. 1.5 mg Versed, 50 mcg  Fentanyl were administered intravenously. The patient's vital signs  were monitored continuously by radiology nursing throughout the  procedure.    Sedation Time: 30 minutes    MEDICATIONS:  None additional  TECHNIQUE:  Informed consent was obtained from the patient following explanation  of the procedure, risks, benefits and alternatives. The patient  understands, agrees and consents for the procedure. All questions  were addressed. A time out was performed.    Both flanks were interrogated with ultrasound. The lower pole of the  left kidney is easily accessible. A suitable skin entry site was  selected and marked. The region was then sterilely prepped and  draped in standard fashion using Betadine skin prep. Local  anesthesia was attained by infiltration with 1% lidocaine. A small  dermatotomy  was made. Under real-time sonographic guidance, a total  of 3 16 gauge core biopsies were obtained using the bio Pince  automated biopsy device.  The biopsy specimens were placed in saline and sent to pathology for  further analysis. Pathology concluded adequate sampling.    Post biopsy ultrasound imaging demonstrates no evidence of active  hemorrhage or significant perinephric hematoma. The patient  tolerated the procedure well.    COMPLICATIONS:  None     IMPRESSION:  Successful ultrasound-guided random core biopsy of the lower pole  cortex of the left kidney.    Signed,  Criselda Peaches, MD    [facility]    [facility]  Electronically Signed    By: Jacqulynn Cadet M.D.    On: 12/01/2014 17:09         Verified By: Criselda Peaches, M.D.,    19-Jan-16 14:36, [place] Color Flow Doppler Upper Extrem Left (Arm)  [place] Color Flow Doppler Upper Extrem Left (Arm)  REASON FOR EXAM:    Swelling  COMMENTS:       PROCEDURE: [place]  - US DOPPLER UP EXTR LEFT  - Dec 06 2014  2:36PM     CLINICAL DATA:  Acute left upper extremity swelling    EXAM:  LEFT UPPER EXTREMITY VENOUS DOPPLER ULTRASOUND    TECHNIQUE:  Gray-scale sonography with graded compression, as well as color  Doppler and duplex ultrasound were performed to evaluate the upper  extremity deep venous system from the level of the subclavian vein  and including the jugular, axillary, basilic, radial, ulnar and  upper cephalic vein. Spectral Doppler was utilized to evaluate flow  at rest and with distal augmentation maneuvers.    COMPARISON:  None.    FINDINGS:  Contralateral Subclavian Vein: Respiratory phasicity is normal and  symmetric with the symptomatic side. No evidence of thrombus. Normal  compressibility.    Internal Jugular Vein: No evidence of thrombus. Normal  compressibility, respiratory phasicity and response to augmentation.    Subclavian Vein: No  evidence of thrombus. Normal compressibility,  respiratory phasicity and response to augmentation.  Axillary Vein: No evidence of thrombus. Normal compressibility,  respiratory phasicity and response to augmentation.    Cephalic Vein: No evidence of thrombus. Normal compressibility,  respiratory phasicity and response to augmentation.    Basilic Vein: No evidence of thrombus. Normal compressibility,  respiratory phasicity and response to augmentation.    Brachial Veins: No evidence of thrombus. Normal compressibility,  respiratory phasicity and response to augmentation.    Radial Veins: No evidence of thrombus. Normal compressibility,  respiratory phasicity and response to augmentation.  Ulnar Veins: No evidence of thrombus. Normal compressibility,  respiratory phasicity and response to augmentation.    Venous Reflux:  None visualized.    Other Findings:  None visualized.     IMPRESSION:  No evidence of deep venous thrombosis.      Electronically Signed    By: Daryll Brod M.D.    On: 12/06/2014 14:44     Verified By: Earl Gala, M.D.,  LabUnknown:    14-Jan-16 16:35, US Guide Needle Biopsy Other Than Breast  PACS Image    19-Jan-16 14:36, [place] Color Flow Doppler Upper Extrem Left (Arm)  PACS Image   ASSESSMENT AND PLAN:  Assessment/Admission Diagnosis ESRD Other medical issues as above   Plan Permcath placed today without event will need outpatient work up for permanent dialysis access Can use catheter now.  Will arrange outpatient follow up    level 4 consult   Electronic Signatures: Algernon Huxley (MD)  (Signed 20-Jan-16 16:19)  Authored: Chief Complaint and History, PAST MEDICAL/SURGICAL HISTORY, ALLERGIES, HOME MEDICATIONS, Family and Social History, Review of Systems, Physical Exam, LABS, RADIOLOGY, Assessment and Plan   Last Updated: 20-Jan-16 16:19 by Algernon Huxley (MD)

## 2015-03-19 NOTE — Consult Note (Signed)
Referring Physician:  Alba Destine :   Primary Care Physician:  Alba Destine : [facility], [address], [place], [place] [address], [place] [id number]  Reason for Consult: Admit Date: 03-Jan-2015  Chief Complaint: confusion  Reason for Consult: confusion   History of Present Illness: History of Present Illness:   seen at request of Dr. Darvin Neighbours for confusion;  81 yo RHD F presents to [facility] from dialysis today.  Pt has been getting dialysis for about two months now but today became hypotensive and then had some incontinence.  She then became unresponsive around 12 noon today.  She was last normal at the beginning of dialysis at 1100am.  She was brought into the ER and still confused with some questionable L sided weakness.  The ER physician contacted Neurology about possible tPA.  Family states that she is still very confused now.  ROS:  Review of Systems   unobtainable secondary to confusionES  Past Medical/Surgical Hx:  denies:   Past Medical/ Surgical Hx:  Past Medical History ESRD, HTN, borderline DM   Past Surgical History dialysis cath   Home Medications: Medication Instructions Last Modified Date/Time  predniSONE 20 mg oral tablet 3 tab(s) orally once a day 16-Feb-16 15:33  pravastatin 40 mg oral tablet 1 tab(s) orally once a day (at bedtime) 16-Feb-16 15:33  losartan 100 mg oral tablet 1 tab(s) orally once a day evening 16-Feb-16 15:33  calcium acetate 667 mg oral tablet 2 tab(s) orally 3 times a day with meals 16-Feb-16 15:33  Aspirin Low Dose 81 mg oral delayed release tablet 1 tab(s) orally once a day 16-Feb-16 15:33  torsemide 20 mg oral tablet 2 tab(s) orally 2 times a day.  16-Feb-16 15:33  psyllium 1 dose(s) orally once a day 16-Feb-16 15:33  Metoprolol Tartrate 50 mg oral tablet 1 tab(s) orally 2 times a day 16-Feb-16 15:33  Levoxyl 100 mcg (0.1 mg) oral tablet 1 tab(s) orally once a day 16-Feb-16 15:33  mirtazapine 15 mg oral tablet,  disintegrating 1 tab(s) orally once a day (at bedtime) 16-Feb-16 15:33  Maalox Advanced Regular Strength 200 mg-200 mg-20 mg/5 mL oral suspension 15 milliliter(s) orally every 6 hours, As Needed - for Indigestion, Heartburn 16-Feb-16 15:33  acetaminophen 650 mg oral tablet, extended release 1 tab(s) orally every 4 hours, As Needed - for Pain 16-Feb-16 15:33   Allergies:  Levaquin: Itching, Rash  Floxin: Itching, Rash  Allergies:  Allergies NKDA   Social/Family History: Employment Status: disabled  Lives With: children  Living Arrangements: house  Social History: no tob, no EtOH, no illicits  Family History: no seizures , no stroke   Vital Signs: **Vital Signs.:   16-Feb-16 16:07  Vital Signs Type Admission  Temperature Temperature (F) 98.1  Celsius 36.7  Temperature Source oral  Pulse Pulse 74  Respirations Respirations 18  Diastolic BP (mmHg) Diastolic BP (mmHg) 78  Mean BP 123  Pulse Ox % Pulse Ox % 97  Pulse Ox Activity Level  At rest  Oxygen Delivery Room Air/ 21 %    16:17  Vital Signs Type Recheck  Systolic BP Systolic BP 638  Diastolic BP (mmHg) Diastolic BP (mmHg) 78  Mean BP 108  BP Source  if not from Vital Sign Device manual   Physical Exam: General: overweight, NAD  HEENT: normocephalic, sclera nonicteric, oropharynx clear  Neck: supple, no JVD, no bruits  Chest: CTA B, no wheezing  Cardiac: RRR, no murmur, 2+ pulses  Extremities: 3+ pitting edema, no  C/C   Neurologic Exam: Mental Status: sleepy and oriented only to person, mild dysarthria, mild echolia but will follow half of simple commands  Cranial Nerves: PERRLA, EOMI, nl VF to threat as it is inconsistent to finger testing, face symmetric, tongue midline, shoulder shrug equal  Motor Exam: trace drift in L UE otherwise 5 /5,  moves B LE equally with motivation, mild asterixis  Deep Tendon Reflexes: 0/4 B, mute plantars B  Sensory Exam: pt seems to not feel as well on the L but always states R with  single or double simultaneous touch; however, with cold pt states the same as well as pinprick, temperature, and vibration intact B  Coordination: untestable due to confusion   Lab Results:  Thyroid:  16-Feb-16 13:13   Thyroid Stimulating Hormone 1.48 (0.45-4.50 (IU = International Unit)  ----------------------- Pregnant patients have  different reference  ranges for TSH:  - - - - - - - - - -  Pregnant, first trimetser:  0.36 - 2.50 uIU/mL)  Hepatic:  16-Feb-16 13:13   Bilirubin, Total 0.5  Alkaline Phosphatase  121  SGPT (ALT) 55  SGOT (AST)  41  Total Protein, Serum  4.4  Albumin, Serum  1.8  Routine Chem:  16-Feb-16 13:13   Result Comment PTT - RESULTS VERIFIED BY REPEAT TESTING.  - Critical value called to and read back  - by Fredda Hammed @ 1500 01/03/15. BGB  Result(s) reported on 03 Jan 2015 at 03:06PM.  Magnesium, Serum  1.7 (1.8-2.4 THERAPEUTIC RANGE: 4-7 mg/dL TOXIC: > 10 mg/dL  -----------------------)  Glucose, Serum  183  BUN  55  Creatinine (comp) 1.23  Sodium, Serum 142  Potassium, Serum  2.9  Chloride, Serum 99  CO2, Serum  37  Calcium (Total), Serum 8.9  Osmolality (calc) 303  eGFR (African American)  54  eGFR (Non-African American)  45 (eGFR values <57m/min/1.73 m2 may be an indication of chronic kidney disease (CKD). Calculated eGFR, using the MRDR Study equation, is useful in  patients with stable renal function. The eGFR calculation will not be reliable in acutely ill patients when serum creatinine is changing rapidly. It is not useful in patients on dialysis. The eGFR calculation may not be applicable to patients at the low and high extremes of body sizes, pregnant women, and vegetarians.)  Anion Gap  6  Cardiac:  16-Feb-16 13:13   Troponin I  0.07 (0.00-0.05 0.05 ng/mL or less: NEGATIVE  Repeat testing in 3-6 hrs  if clinically indicated. >0.05 ng/mL: POTENTIAL  MYOCARDIAL INJURY. Repeat  testing in 3-6 hrs if  clinically  indicated. NOTE: An increase or decrease  of 30% or more on serial  testing suggests a  clinically important change)  Routine UA:  16-Feb-16 13:51   Color (UA) Yellow  Clarity (UA) Clear  Glucose (UA) >=500  Bilirubin (UA) Negative  Ketones (UA) Negative  Specific Gravity (UA) 1.010  Blood (UA) Negative  pH (UA) 7.0  Protein (UA) >=500  Nitrite (UA) Negative  Leukocyte Esterase (UA) Negative (Result(s) reported on 03 Jan 2015 at 02:34PM.)  RBC (UA) 1 /HPF  WBC (UA) 2 /HPF  Bacteria (UA) NONE SEEN  Epithelial Cells (UA) 1 /HPF  Hyaline Cast (UA) 10 /LPF (Result(s) reported on 03 Jan 2015 at 02:34PM.)  Routine Coag:  16-Feb-16 13:13   Prothrombin 12.4 (11.4-15.0 NOTE: New Reference Range  12/16/14)  INR 0.9 (INR reference interval applies to patients on anticoagulant therapy. A single INR therapeutic range for coumarins is not  optimal for all indications; however, the suggested range for most indications is 2.0 - 3.0. Exceptions to the INR Reference Range may include: Prosthetic heart valves, acute myocardial infarction, prevention of myocardial infarction, and combinations of aspirin and anticoagulant. The need for a higher or lower target INR must be assessed individually. Reference: The Pharmacology and Management of the Vitamin K  antagonists: the seventh ACCP Conference on Antithrombotic and Thrombolytic Therapy. QVZDG.3875 Sept:126 (3suppl): [id number]. A HCT value >55% may artifactually increase the PT.  In one study,  the increase was an average of 25%. Reference:  "Effect on Routine and Special Coagulation Testing Values of Citrate Anticoagulant Adjustment in Patients with High HCT Values." American Journal of Clinical Pathology 2006;126:400-405.)  Activated PTT (APTT)  65.0 (A HCT value >55% may artifactually increase the APTT. In one study, the increase was an average of 19%. Reference: "Effect on Routine and Special Coagulation Testing Values of Citrate  Anticoagulant Adjustment in Patients with High HCT Values." American Journal of Clinical Pathology 2006;126:400-405.)  Routine Hem:  16-Feb-16 13:13   WBC (CBC)  22.5  RBC (CBC)  3.35  Hemoglobin (CBC)  9.6  Hematocrit (CBC)  29.7  Platelet Count (CBC) 222 (Result(s) reported on 03 Jan 2015 at 01:31PM.)  MCV 89  MCH 28.6  MCHC 32.2  RDW 13.8   Radiology Results: CT:    16-Feb-16 13:34, CT Head Without Contrast  CT Head Without Contrast   REASON FOR EXAM:    confusion with syncope  COMMENTS:   May transport without cardiac monitor    PROCEDURE: CT  - CT HEAD WITHOUT CONTRAST  - Jan 03 2015  1:34PM     CLINICAL DATA:  Confusion with syncope    EXAM:  CT HEAD WITHOUT CONTRAST    TECHNIQUE:  Contiguous axial images were obtained from the base of the skull  through the vertex without intravenous contrast.    COMPARISON:  MRI brain 04/03/2007  FINDINGS:  There is no evidence of mass effect, midline shift, or extra-axial  fluid collections. There is no evidence of a space-occupying lesion  or intracranial hemorrhage. There is no evidence of a cortical-based  area of acute infarction. There is generalized cerebral atrophy.  There is periventricular white matter low attenuation likely  secondary to microangiopathy.    The ventricles and sulci are appropriate for the patient's age. The  basal cisterns are patent.    Visualized portions of the orbits are unremarkable. The visualized  portions of the paranasal sinuses and mastoid aircells are  unremarkable. Cerebrovascular atherosclerotic calcifications are  noted.  The osseous structures are unremarkable.     IMPRESSION:  1. No acute intracranial pathology.  2. Chronic microvascular disease and cerebral atrophy.      Electronically Signed    By: Kathreen Devoid    On: 01/03/2015 14:10         Verified By: Jennette Banker, M.D., MD   Radiology Impression: Radiology Impression: CT of head personally reviewed by me and  shows moderate atrophy, minimal white matter changes   Impression/Recommendations: Recommendations:   prior notes reviewed by me reviewed by me   Possible seizure vs. dialysis dysequilibrium syndrome vs. metabolic encephalopathy-  pt has leukocytosis which can be seen in the first two.  Mild asterixis goes along with metabolic encephalopathy but could be post-ictal too.  Pt has only been recieving dialysis for 2 months now so is at risk for dysequilibrium syndrome.  Sometimes a R parietal infarct can cause confusion  as well but would not be expected to cause syncope symptoms. Mild L sided weakness-  this could be Todds or stroke, there is a very unclear onset time, elevated PTT and minimal deficits so would not give tPA in this case MRI of brain w/o contrast EEG check ammonia and other electrolytes no anti-epileptics at this time needs infectious w/u keep Mg > 2 and Ca > 8 will follow  Electronic Signatures: Jamison Neighbor (MD)  (Signed 16-Feb-16 21:36)  Authored: REFERRING PHYSICIAN, Primary Care Physician, Consult, History of Present Illness, Review of Systems, PAST MEDICAL/SURGICAL HISTORY, HOME MEDICATIONS, ALLERGIES, Social/Family History, NURSING VITAL SIGNS, Physical Exam-, LAB RESULTS, RADIOLOGY RESULTS, Recommendations   Last Updated: 16-Feb-16 21:36 by Jamison Neighbor (MD)

## 2015-03-19 NOTE — Consult Note (Signed)
PATIENT NAME:  Jennifer Roberts, Jennifer Roberts MR#:  [id number] DATE OF BIRTH:  1932/10/07  DATE OF CONSULTATION:  01/13/2015  REFERRING PHYSICIAN:  Enid Baasadhika Kalisetti, MD  CONSULTING PHYSICIAN:  Stann Mainlandavid P. Sampson GoonFitzgerald, MD  REASON FOR CONSULTATION: Altered mental status.   HISTORY OF PRESENT ILLNESS: This is an 81 year old female with a history of hypertension, diabetes, hyperlipidemia, dementia, and end-stage renal disease on dialysis, who was initially admitted February 16 with an episode of syncope and confusion lasting about 20 minutes. It was felt that the patient may either have acute CVA or dialysis dysequilibrium syndrome, as she had recently started off back on dialysis after several weeks off. She had only recently been diagnosed with minimal change disease and had been started in January on high-dose prednisone 60 mg daily. Since admission, the patient has had a lumbar puncture and EEG, and has been followed closely by neurology. She has had 17 white cells on her CSF, and there is concern for possible CSF infection. Repeat imaging has shown a new CVA and cerebral edema involving the bilateral MCA territories, right greater than left.   PAST MEDICAL HISTORY: 1. 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  2. Hypertension.  3. Diabetes.  4. Depression.  5. Dementia.  6. Malnutrition.  7. Hypothyroidism.  8. Hyperlipidemia.   SURGICAL HISTORY: Placement of a dialysis catheter.   SOCIAL HISTORY: She ambulates with a walker. Nonsmoker, no alcohol.   ALLERGIES: QUINOLONES.   REVIEW OF SYSTEMS: Unable to be obtained.   FAMILY HISTORY: Noncontributory.   ANTIBIOTICS SINCE ADMISSION: Include:  1. Acyclovir, begun 01/08/2015.  2. Ceftriaxone, February 18-22 3. Zosyn, February 16-17.  4. Vancomycin, given February 17.    PHYSICAL EXAMINATION: VITAL SIGNS: Temperature 96.1, pulse 68, blood pressure 132/61, respirations 18, saturation 93% on room air. She has been  completely afebrile since admission.  GENERAL: She is very confused, unable to really answer questions, but she is awake.  HEENT: Pupils are reactive. Sclerae are anicteric. Oropharynx is clear with no thrush.  NECK: Supple.  HEART: Regular.  LUNGS: Clear. She has, in her right chest wall, a tunneled dialysis catheter.  ABDOMEN: Soft, nontender, nondistended.  EXTREMITIES: She has 2+ edema, bilateral lower extremities.  NEUROLOGIC: She is confused. She appears to be able to move her right side, but seems to have some weakness on the left, although it is difficult to have her cooperate with the neurologic exam.   DIAGNOSTIC DATA: MRI, done February 24, shows marked worsening of cerebral edema compared with previously unremarkable, but motion degraded MR from February 17. The pattern of involvement affecting the bilateral MCA territory, right greater than left, suggests interval development of acute infarcts. The pattern of restricted diffusion in cytotoxic edema does not favor an encephalopathic process. There is small atrophy and small vessel disease. Lumbar puncture, February 22, revealed 17 white cells, 5 red cells. The differential was 45% neutrophils, 16% lymphocytes, 39% macrophages. Protein was normal at 24. Glucose slightly elevated at 82. Gram stain showed only few white cells and moderate red cells and no growth. Blood cultures done on admission, February 16, were no growth. White blood count on admission, February 19, was 18.5; on February 24, it is 13.8, hemoglobin 9.2, platelets 150,000. Renal function is consistent with end-stage renal disease. Albumin on February 24, quite low at 1.7 February 20, alkaline phosphatase slightly elevated at 143, but total bilirubin, AST, and ALT are normal. CRP, February 20, was 13.4. CMV PCR on the plasma  was positive, but at very low level, less than 200. ANA testing was negative. HSV PCR on the CSF was negative. EBV  panel is consistent with prior infection.  RPR was nonreactive. HIV was negative.   IMPRESSION: An 81 year old female with multiple medical problems, but on 60 mg of prednisone for over a month due to a new diagnosis of minimal change disease, who was admitted with altered mental status. Since then, she has not had a fever, but did initially have an elevated white count. She remains quite confused and far from her baseline, according to review of notes. She has had an MRI that does show new infarcts. Cerebral spinal fluid was mildly abnormal with a white count of 17,000, but protein normal. Herpes simplex virus PCR on the cerebral spinal fluid, as well as culture have been negative. Cytomegalovirus PCR on the sternum is positive, but with very low titers. Cytomegalovirus does not seem to have been done on the cerebral spinal fluid.   I suspect this is not an infectious process; however, there is a potential this could be Listeria cerebritis, given the fact that she has been on prednisone at high doses for several weeks.   RECOMMENDATIONS:   1.  Start ampicillin and monitor.  2.  I will touch base with the micro lab to see if there is enough CSF to add on CMV PCR.  3.  Thank you for the consult. I will be glad to follow with you     ____________________________ Stann Mainland. Sampson Goon, MD dpf:mw D: 01/13/2015 08:50:30 ET T: 01/13/2015 13:23:23 ET JOB#: [id number]  cc: Stann Mainland. Sampson Goon, MD, <Dictator> DAVID Sampson Goon MD ELECTRONICALLY SIGNED 01/19/2015 19:54

## 2015-03-19 NOTE — Consult Note (Signed)
General Aspect Primary Cardiologist: New to [facility] _________________  81 year old female with history of recently diagnosed ESRD on HD (11/2014), hypertension, diabetes, hyperlipidemia, and dementia who was recently admitted to [facility] from 1/14-1/27 for acute renal failure returns to [facility] on 2/16 for an episode of syncope and confusion for about 20 minutes. _________________  PMH: 1. recently diagnosed ESRD on HD (11/2014) 2. HTN 3. DM2 4. HLD 5. Dementia ________________   Present Illness 81 year old female with the above problem list who was recently admitted to [facility] on 1/14-1/27 for acute renal failure returns to [facility] on 2/16 for an episode of of syncope and confusion for about 20 minutes. History is taken from multiple family members present at her bedside. They all deny any previously known cardiac history and state she was healthy prior to her admission back in January.   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 During that admission she was started on HD. She is status post kidney biopsy on 12/01/2014. She has not had any dialysis for 3 weeks and has been on prednisone 60 mg daily, which she was discharged on. She has had increasing anasarca and was restarted back on HD on 2/16. This was her ffirst session in 3 weeks. Post HD she became confused and initially had some left sided weakness that ultimately resolved. Initially, TPA was considered. Neurology was consulted who felt this could be dialysis disequilibrium syndrome or status post seizure with Todd paralysis. She underwent CT of the head that showed no acute pathology, chronic microvascular changes. She also underwent MRI of the brain that showed Moderate atrophy and white matter disease. This likely reflects the sequela of chronic microvascular ischemia. No acute intracranial abnormality or focal lesion to explain the patient's facial droop or altered mental status. EEG showed no evidence of  epilepsy. There was intermittent generalized slowing consistent with toxic or metabolic encephalopathy. Troponin peaked at 0.20. EKG showed sinus bradycardia with blocked PACs. She is currently resting in her bed with multiple family members.   Physical Exam:  GEN critically ill appearing   HEENT hearing intact to voice   NECK supple   RESP normal resp effort  clear BS   CARD Regular rate and rhythm  Murmur   ABD denies tenderness  soft   EXTR negative edema   SKIN normal to palpation   PSYCH lethargic   Review of Systems:  ROS Pt not able to provide ROS  lethargic   Family & Social History:  Family and Social History:  Family History Diabetes Mellitus  Cancer   Social History negative tobacco, negative ETOH, negative Illicit drugs   Place of Living Nursing Home   Home Medications: Medication Instructions Status  predniSONE 20 mg oral tablet 3 tab(s) orally once a day Active  pravastatin 40 mg oral tablet 1 tab(s) orally once a day (at bedtime) Active  losartan 100 mg oral tablet 1 tab(s) orally once a day evening Active  calcium acetate 667 mg oral tablet 2 tab(s) orally 3 times a day with meals Active  Aspirin Low Dose 81 mg oral delayed release tablet 1 tab(s) orally once a day Active  torsemide 20 mg oral tablet 2 tab(s) orally 2 times a day.  Active  psyllium 1 dose(s) orally once a day Active  Metoprolol Tartrate 50 mg oral tablet 1 tab(s) orally 2 times a day Active  Levoxyl 100 mcg (0.1 mg) oral tablet 1 tab(s) orally once  a day Active  mirtazapine 15 mg oral tablet, disintegrating 1 tab(s) orally once a day (at bedtime) Active  Maalox Advanced Regular Strength 200 mg-200 mg-20 mg/5 mL oral suspension 15 milliliter(s) orally every 6 hours, As Needed - for Indigestion, Heartburn Active  acetaminophen 650 mg oral tablet, extended release 1 tab(s) orally every 4 hours, As Needed - for Pain Active   Lab Results:  Hepatic:  17-Feb-16 00:31   Bilirubin, Total  0.4  Alkaline Phosphatase 100  SGPT (ALT) 49  SGOT (AST) 35  Total Protein, Serum  4.0  Albumin, Serum  1.7  Routine Chem:  16-Feb-16 13:13   Potassium, Serum  2.9  Creatinine (comp) 1.23  Sodium, Serum 142  17-Feb-16 00:31   Potassium, Serum  3.0  Result Comment TROPONIN - RESULTS VERIFIED BY REPEAT TESTING.  - PREV. C/ 01-03-15 _0 .Marland KitchenMarland KitchenAJO  Result(s) reported on 04 Jan 2015 at 01:17AM.  Glucose, Serum  170  BUN  56  Creatinine (comp)  1.40  Sodium, Serum 143  Chloride, Serum 99  CO2, Serum  37  Calcium (Total), Serum 8.7  Osmolality (calc) 304  eGFR (African American)  47  eGFR (Non-African American)  38 (eGFR values <29m/min/1.73 m2 may be an indication of chronic kidney disease (CKD). Calculated eGFR, using the MRDR Study equation, is useful in  patients with stable renal function. The eGFR calculation will not be reliable in acutely ill patients when serum creatinine is changing rapidly. It is not useful in patients on dialysis. The eGFR calculation may not be applicable to patients at the low and high extremes of body sizes, pregnant women, and vegetarians.)  Anion Gap 7  Cardiac:  16-Feb-16 13:13   Troponin I  0.07 (0.00-0.05 0.05 ng/mL or less: NEGATIVE  Repeat testing in 3-6 hrs  if clinically indicated. >0.05 ng/mL: POTENTIAL  MYOCARDIAL INJURY. Repeat  testing in 3-6 hrs if  clinically indicated. NOTE: An increase or decrease  of 30% or more on serial  testing suggests a  clinically important change)    20:13   Troponin I  0.10 (0.00-0.05 0.05 ng/mL or less: NEGATIVE  Repeat testing in 3-6 hrs  if clinically indicated. >0.05 ng/mL: POTENTIAL  MYOCARDIAL INJURY. Repeat  testing in 3-6 hrs if  clinically indicated. NOTE: An increase or decrease  of 30% or more on serial  testing suggests a  clinically important change)  17-Feb-16 00:31   Troponin I  0.20 (0.00-0.05 0.05 ng/mL or less: NEGATIVE  Repeat testing in 3-6 hrs  if clinically  indicated. >0.05 ng/mL: POTENTIAL  MYOCARDIAL INJURY. Repeat  testing in 3-6 hrs if  clinically indicated. NOTE: An increase or decrease  of 30% or more on serial  testing suggests a  clinically important change)  Routine Hem:  17-Feb-16 00:31   WBC (CBC)  26.0  RBC (CBC)  3.16  Hemoglobin (CBC)  9.2  Hematocrit (CBC)  27.9  Platelet Count (CBC) 205  MCV 88  MCH 29.0  MCHC 32.8  RDW 13.9  Neutrophil % 90.6  Lymphocyte % 3.4  Monocyte % 5.2  Eosinophil % 0.0  Basophil % 0.8  Neutrophil #  23.6  Lymphocyte #  0.9  Monocyte #  1.3  Eosinophil # 0.0  Basophil #  0.2 (Result(s) reported on 04 Jan 2015 at 12:54AM.)   EKG:  EKG Interp. by me   Interpretation sinus bradycardia with blocked PACs, 59 bpm, prolonged QTc   Radiology Results: XRay:    16-Feb-16 13:21, Chest Portable Single  View  Chest Portable Single View   REASON FOR EXAM:    Altered Mental Status  COMMENTS:       PROCEDURE: DXR - DXR PORTABLE CHEST SINGLE VIEW  - Jan 03 2015  1:21PM     CLINICAL DATA:  81 year old female with syncope and hypotension  during dialysis. Initial encounter.    EXAM:  PORTABLE CHEST - 1 VIEW    COMPARISON:  04/02/2007.    FINDINGS:  Portable AP upright view at 1317 hours. Right chest tunneled dual  lumen dialysis catheter. Mild cardiomegaly. Other mediastinal  contours are within normal limits. Allowing for portable technique,  the lungs are clear.     IMPRESSION:  No acute cardiopulmonary abnormality.      Electronically Signed    By: Genevie Ann M.D.    On: 01/03/2015 14:03         Verified By: Gwenyth Bender. HALL, M.D.,    17-Feb-16 06:29, Chest Portable Single View  Chest Portable Single View   REASON FOR EXAM:    ? asp from seizure  COMMENTS:       PROCEDURE: DXR - DXR PORTABLE CHEST SINGLE VIEW  - Jan 04 2015  6:29AM     CLINICAL DATA:  Seizure activity with possible aspiration    EXAM:  PORTABLE CHEST - 1 VIEW    COMPARISON:  Portable chest x-ray of 03 January 2015    FINDINGS:  The lungs are mildly hyperinflated. There is no focal infiltrate.  There is apical pleural thickening bilaterally. The heart and  pulmonary vascularity are normal. The dialysis catheter tip projects  over the lower third of the SVC. The bony thorax is unremarkable.     IMPRESSION:  There are no findings to suggest aspiration currently. When the  patient can tolerate the procedure, a PA and lateral chest x-ray  would be useful.      Electronically Signed    By: David  Martinique    On: 01/04/2015 07:32         Verified By: DAVID A. Martinique, M.D., MD  MRI:    17-Feb-16 10:12, MRI Brain Without Contrast  MRI Brain Without Contrast   REASON FOR EXAM:    cva. Left weakness  COMMENTS:       PROCEDURE: MR  - MR BRAIN WO CONTRAST  - Jan 04 2015 10:12AM     CLINICAL DATA:  Altered mental status. Seizure with subsequent left  facial droop.    EXAM:  MRI HEAD WITHOUT CONTRAST    TECHNIQUE:  Multiplanar, multiecho pulse sequences of the brain and surrounding  structures were obtained without intravenous contrast.  COMPARISON:  CT head without contrast 01/03/2015. MRI brain  04/03/2007.    FINDINGS:  The diffusion-weighted images demonstrate no evidence for acute or  subacute infarction. Atrophy and white matter disease is evident  bilaterally. No acute hemorrhage or mass lesion is present. The  ventricles are proportionate to the degree of atrophy. Flow is  present in the major intracranial arteries.    The globes and orbits are intact. Midline structures are within  normal limits. The paranasal sinuses and mastoid air cells are  clear.     IMPRESSION:  1. Moderate atrophy and white matter disease. This likely reflects  the sequela of chronic microvascular ischemia.  2. No acute intracranial abnormality or focal lesion to explain the  patient's facial droop or altered mental status.      Electronically Signed    By: Harrell Gave  Mattern M.D.     On: 01/04/2015 10:20         Verified By: Resa Miner. MATTERN, M.D.,  CT:    16-Feb-16 13:34, CT Head Without Contrast  CT Head Without Contrast   REASON FOR EXAM:    confusion with syncope  COMMENTS:   May transport without cardiac monitor    PROCEDURE: CT  - CT HEAD WITHOUT CONTRAST  - Jan 03 2015  1:34PM     CLINICAL DATA:  Confusion with syncope    EXAM:  CT HEAD WITHOUT CONTRAST    TECHNIQUE:  Contiguous axial images were obtained from the base of the skull  through the vertex without intravenous contrast.    COMPARISON:  MRI brain 04/03/2007  FINDINGS:  There is no evidence of mass effect, midline shift, or extra-axial  fluid collections. There is no evidence of a space-occupying lesion  or intracranial hemorrhage. There is no evidence of a cortical-based  area of acute infarction. There is generalized cerebral atrophy.  There is periventricular white matter low attenuation likely  secondary to microangiopathy.    The ventricles and sulci are appropriate for the patient's age. The  basal cisterns are patent.    Visualized portions of the orbits are unremarkable. The visualized  portions of the paranasal sinuses and mastoid aircells are  unremarkable. Cerebrovascular atherosclerotic calcifications are  noted.  The osseous structures are unremarkable.     IMPRESSION:  1. No acute intracranial pathology.  2. Chronic microvascular disease and cerebral atrophy.      Electronically Signed    By: Kathreen Devoid    On: 01/03/2015 14:10         Verified By: Jennette Banker, M.D., MD    Levaquin: Itching, Rash  Floxin: Itching, Rash  Vital Signs/Nurse's Notes: **Vital Signs.:   17-Feb-16 13:00  Vital Signs Type Routine  Temperature Temperature (F) 99.7  Celsius 37.6  Temperature Source oral  Pulse Pulse 108  Respirations Respirations 20  Systolic BP Systolic BP 299  Diastolic BP (mmHg) Diastolic BP (mmHg) 95  Mean BP 103  Pulse Ox % Pulse Ox % 93   Oxygen Delivery Room Air/ 21 %    Impression 80 year old female with history of recently diagnosed ESRD on HD (11/2014), hypertension, diabetes, hyperlipidemia, and dementia who was recently admitted to [facility] from 1/14-1/27 for acute renal failure returns to [facility] on 2/16 for an episode of syncope and confusion for about 20 minutes.  1. Altered mental status: -EEG showed intermittent generalized slowing consistent with toxic or metabolic encephalopathy -Continue supportive and infectious treatment -Neuro on board  2. Arrhythmia: -Review of telemetry shows what appears to be NSR with blocked PACs, no definite a-fib -Will get 12-lead EKG to verify, may need multiple 12-leads -Bradycardic HR is c/w post conversion pauses seen with blocked PACs as the SA node is resetting  -Unlikely etiology for her presenting symptoms -Continue to monitor on telemetry   3. Elevated troponin: -Peak of 0.20 -Check echo to evaluate LV function and wall motion (no prior ischemic evaluations) -She is unable to tell me if she is having chest pain -Hold heparin gtt at this time -Possibly 2/2 #1  4. ESRD: -Volume management per HD -Per Renal  5. Leukocytosis: -On high dose prednisone  6. Anemia of chronic disease: -Stable   Plan ATTENDING ATTESTATION: I saw & examined the pateint along with MR. Gabriele Loveland, PA-c this PM.  Together, we reviewed the chart & available data - including EKG &  Tele. EKG would acutally suggest SR/Stachy with intermittent blocked PACs  (occasionally in Bi or Trigeminy pattern) -- unfortunately the Tele monitor reads Afib, but P waves are clearly visible.  Would have a low threshold for obtaining 12 Lead EKG for more accurate Dx.  In setting of what may well have been a CVA, knowing for sure if Afib is present is crucial.  Current Tele does not look like Afib.  Would avoid AV Nodal agents.  Unless Recommended by Neuro, would not anticoagulate.  Will follow Tele along with you, but would  not appear to have active Cardiac concerns. Mild Troponin elevation - agree with Echo (especially in light of ? CVA).  DH   Electronic Signatures: Rise Mu (PA-C)  (Signed 17-Feb-16 15:30)  Authored: General Aspect/Present Illness, History and Physical Exam, Review of System, Family & Social History, Home Medications, Labs, EKG , Radiology, Allergies, Vital Signs/Nurse's Notes, Impression/Plan Leonie Man (MD)  (Signed 17-Feb-16 18:39)  Authored: Impression/Plan  Co-Signer: General Aspect/Present Illness, History and Physical Exam, Review of System, Family & Social History, Home Medications, Labs, EKG , Radiology, Allergies, Vital Signs/Nurse's Notes, Impression/Plan   Last Updated: 17-Feb-16 18:39 by Leonie Man (MD)

## 2015-03-19 NOTE — Op Note (Signed)
PATIENT NAME:  Jennifer Roberts, Asees F MR#:  [id number] DATE OF BIRTH:  Mar 29, 1932  DATE OF PROCEDURE:  12/07/2014  PREOPERATIVE DIAGNOSES:  End-stage renal disease.   POSTOPERATIVE DIAGNOSES: End-stage renal disease.   PROCEDURES: 1.  Ultrasound guidance for vascular access to right internal jugular vein.  2.  Fluoroscopic guidance for placement of catheter.  3. Placement of a 19 cm tip-to-cuff tunneled hemodialysis catheter via the right internal jugular vein.   SURGEON:  Annice NeedyJason S. Joseluis Alessio, MD  ANESTHESIA: Local with sedation.   BLOOD LOSS: Minimal.   INDICATION FOR PROCEDURE: An [age] year old female with chronic kidney disease. She has now progressed to end-stage renal failure. We were asked to place a PermCath. The risks and benefits were discussed. Informed consent was obtained.   DESCRIPTION OF THE PROCEDURE: The patient was brought to the vascular and interventional radiology suite. The patient's right neck and chest were sterilely prepped and draped and a sterile surgical field was created. The right internal jugular vein was visualized with ultrasound and found to be patent. It was then accessed under direct ultrasound guidance and a permanent image was recorded. A wire was placed. After a skin nick and dilatation, the peel-away sheath was placed over the wire.   I then turned my attention to an area under the clavicle. Approximately 2 fingerbreadths below the clavicle a small counter incision was created and we tunneled from the subclavicular incision to the access site. Using fluoroscopic guidance, a 19 cm tip-to-cuff tunneled hemodialysis catheter was selected, tunneled from the subclavicular incision to the access site. It was then placed through the peel-away sheath and the peel-away sheath was removed. The catheter tips were parked in the right atrium. The appropriate distal connectors were placed. It withdrew blood well and flushed easily with heparinized saline and a concentrated heparin  solution was then placed. It was secured to the chest wall with 2 Prolene sutures. The access incision was closed with a single 4-0 Monocryl. A 4-0 Monocryl pursestring suture was placed around the exit site. Sterile dressings were placed.   The patient tolerated the procedure well and was taken to the recovery room in stable condition.    ____________________________ Annice NeedyJason S. Jamiel Goncalves, MD jsd:mw D: 12/07/2014 16:22:36 ET T: 12/07/2014 19:53:31 ET JOB#: [id number]  cc: Annice NeedyJason S. Neyah Ellerman, MD, <Dictator> Annice NeedyJASON S Metta Koranda MD ELECTRONICALLY SIGNED 12/08/2014 10:42

## 2016-11-12 IMAGING — US US RENAL KIDNEY
1 series · 14 of 24 positions shown · non-contrast
Comparison: 04/10/2007

CLINICAL DATA: Chronic stage 3 kidney disease.

EXAM:
RENAL/URINARY TRACT ULTRASOUND COMPLETE

[Series 1: us renal kidney · 0.28mm/px · 14 of 24 slices shown]
[im 1/24]
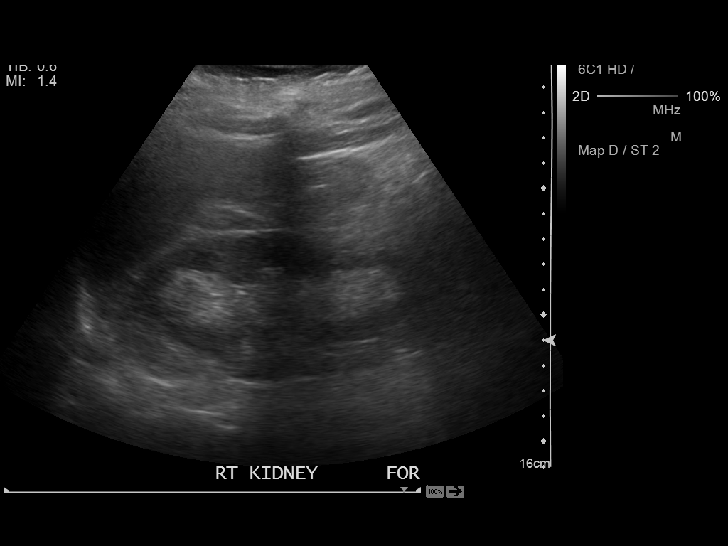
[im 3/24]
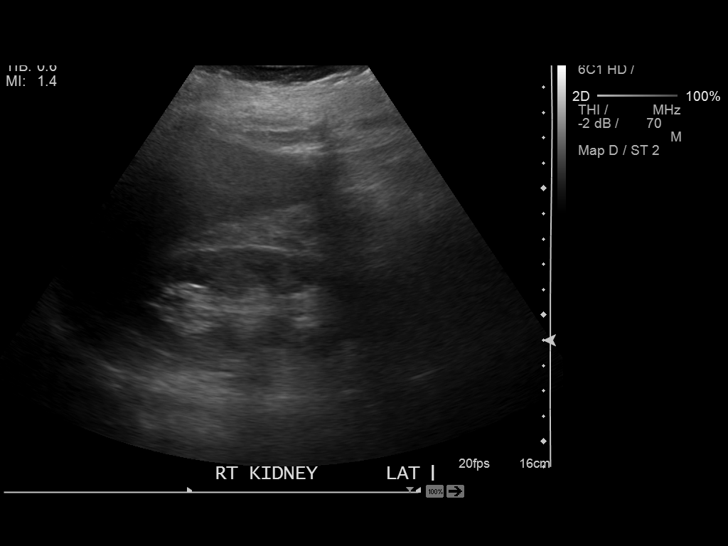
[im 5/24]
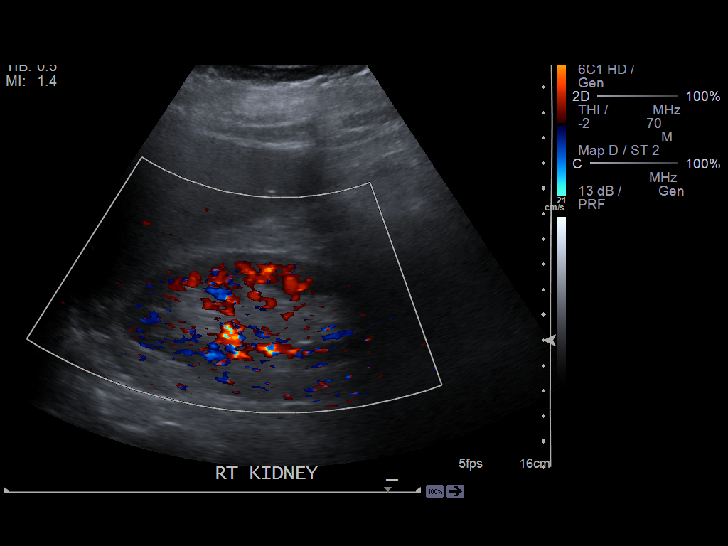
[im 7/24]
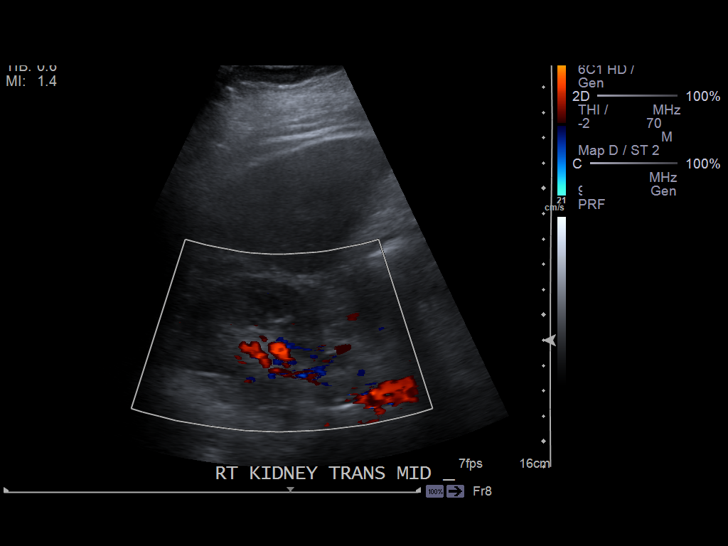
[im 8/24]
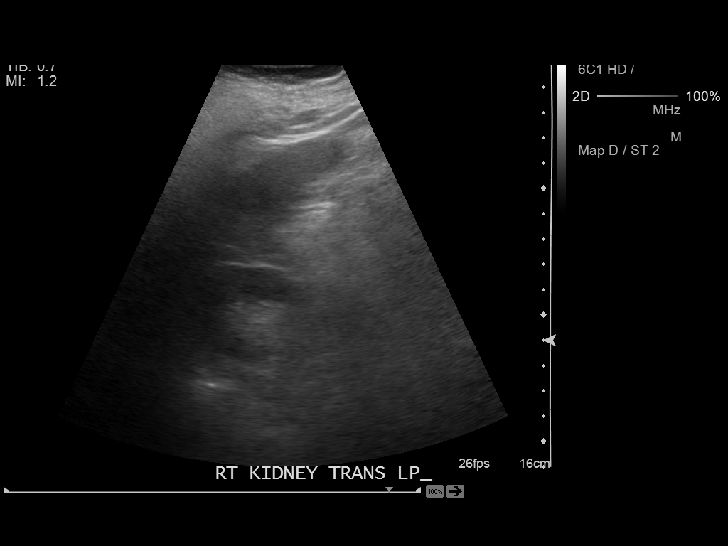
[im 10/24]
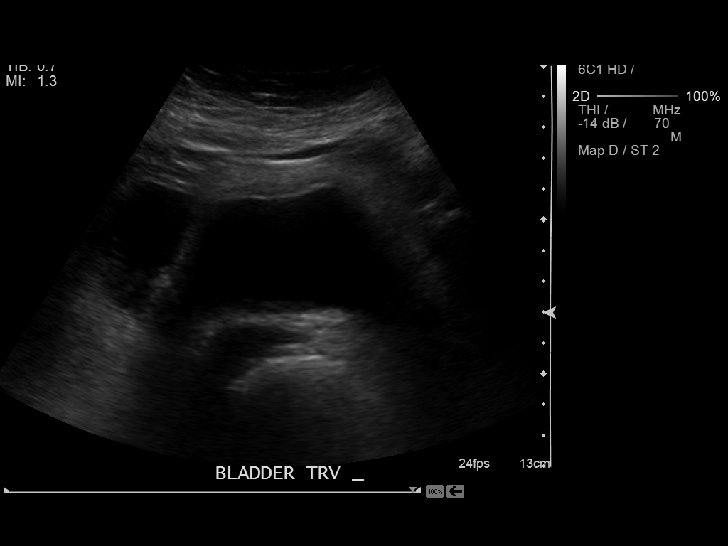
[im 12/24]
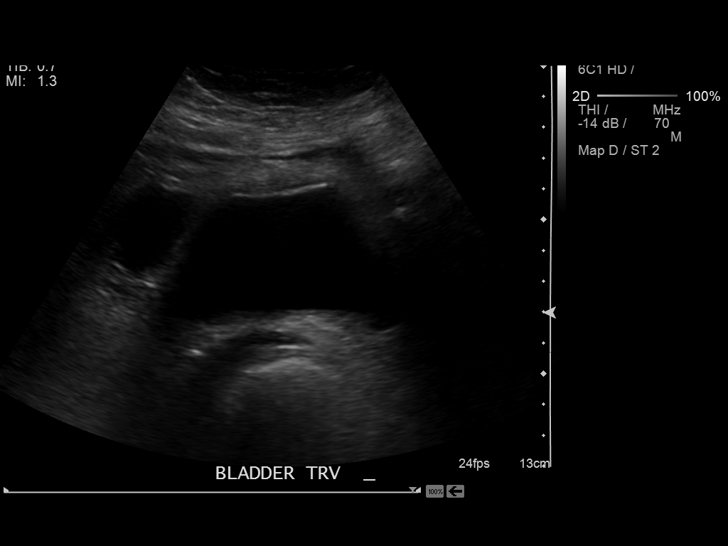
[im 13/24]
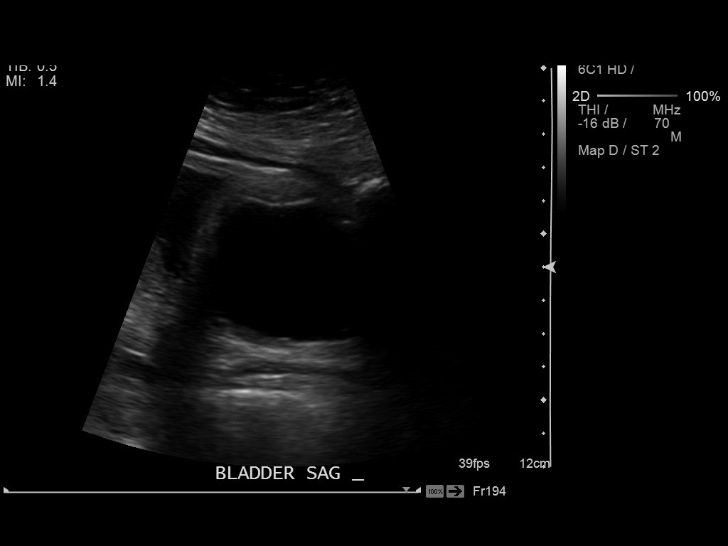
[im 15/24]
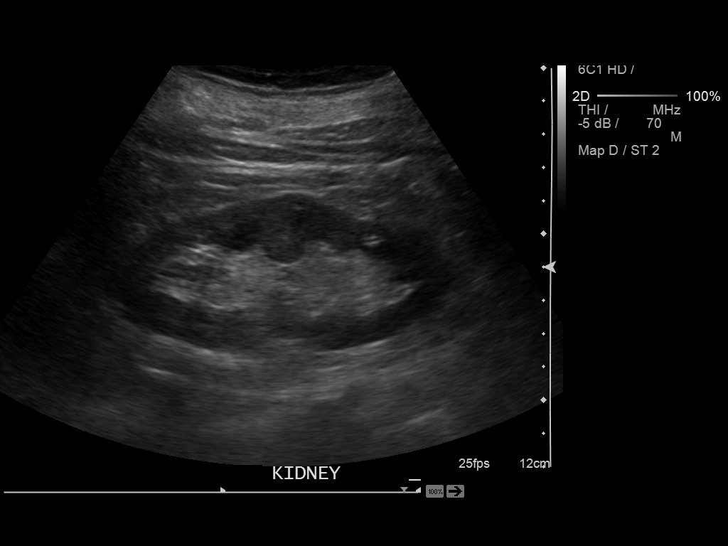
[im 17/24]
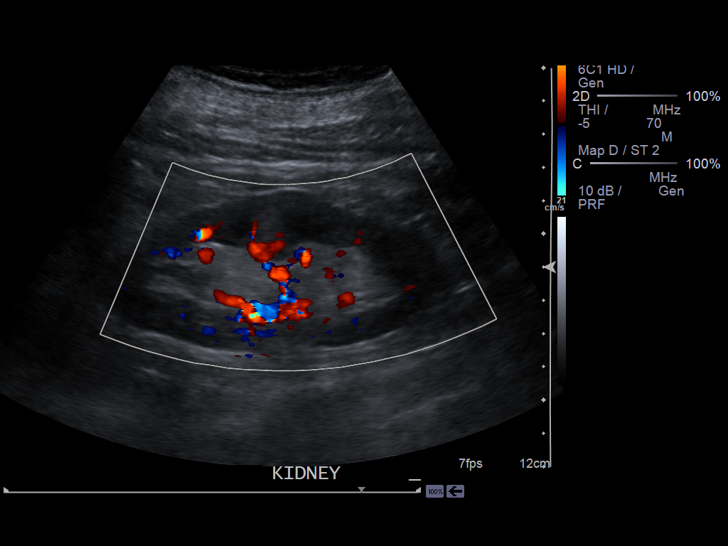
[im 19/24]
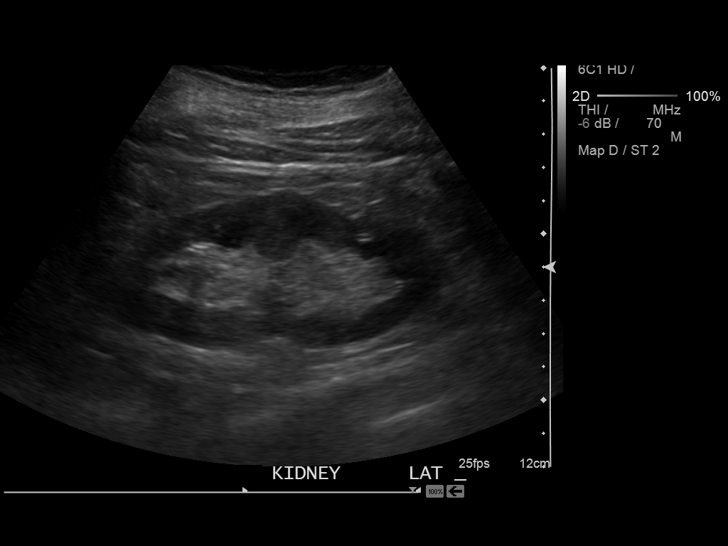
[im 20/24]
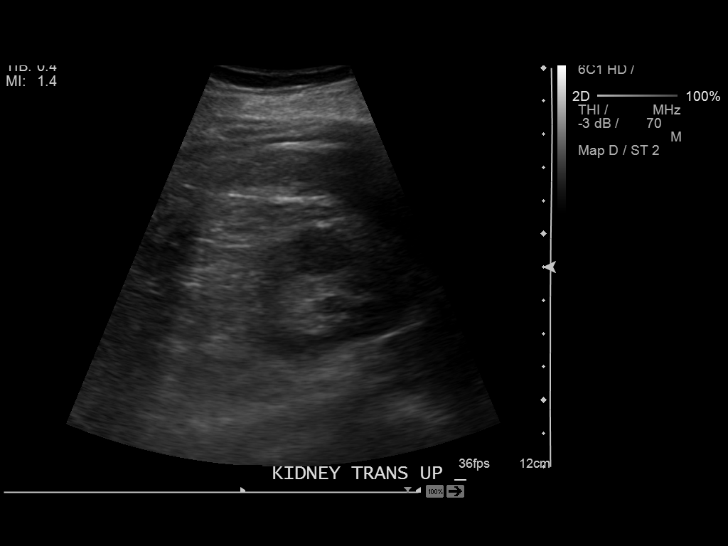
[im 22/24]
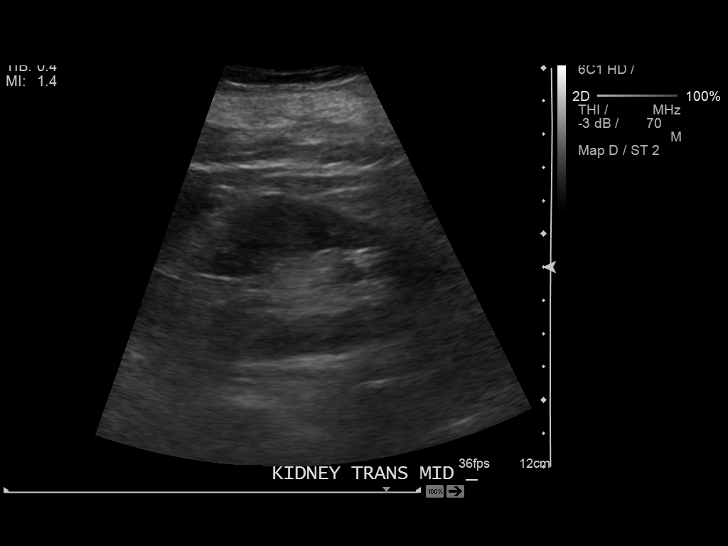
[im 24/24]
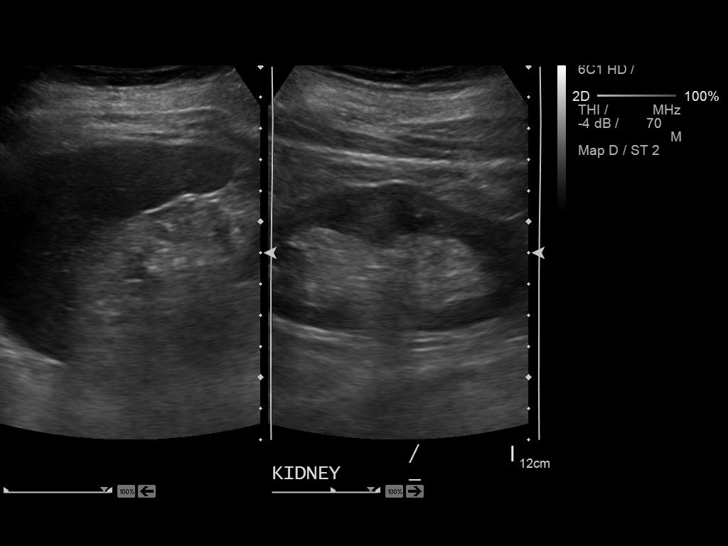

[14 of 24 positions shown; findings below may reference images not displayed]

FINDINGS: Right Kidney:

Length: 11.9 cm. Normal renal cortical thickness and echogenicity
without focal lesions or hydronephrosis.

Left Kidney:

Length: 10.5 cm. Normal renal cortical thickness and echogenicity
without focal lesions or hydronephrosis..

Bladder:

Appears normal for degree of bladder distention.
IMPRESSION: Normal renal ultrasound examination.

## 2016-11-18 IMAGING — US US EXTREM UP VENOUS*L*
1 series · 13 of 24 positions shown · non-contrast
Comparison: None.

CLINICAL DATA: Acute left upper extremity swelling



[Series 1: us extrem up venous*left* · 0.08mm/px · 13 of 35 slices shown]
[im 1/35]
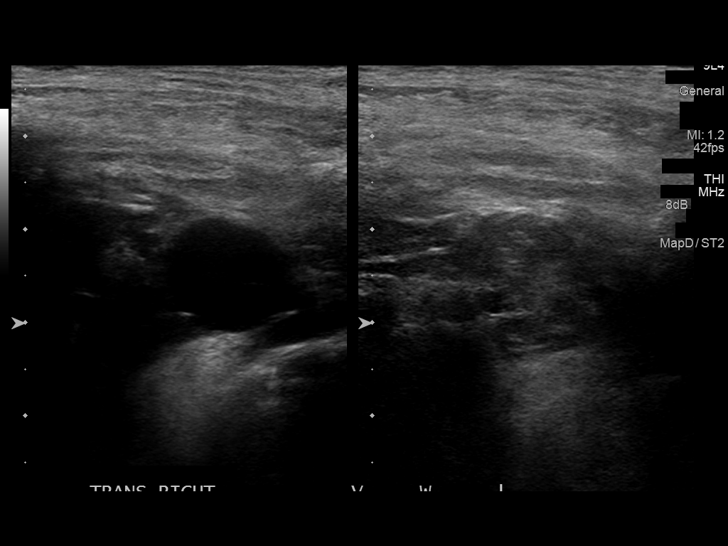
[im 3/35]
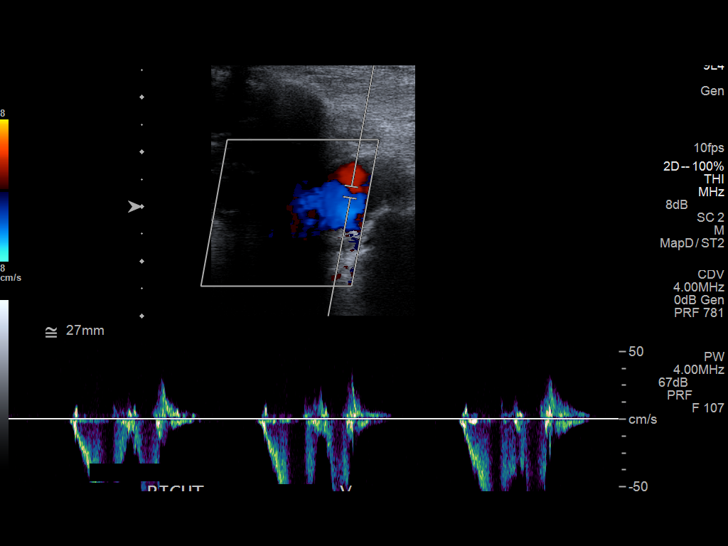
[im 6/35]
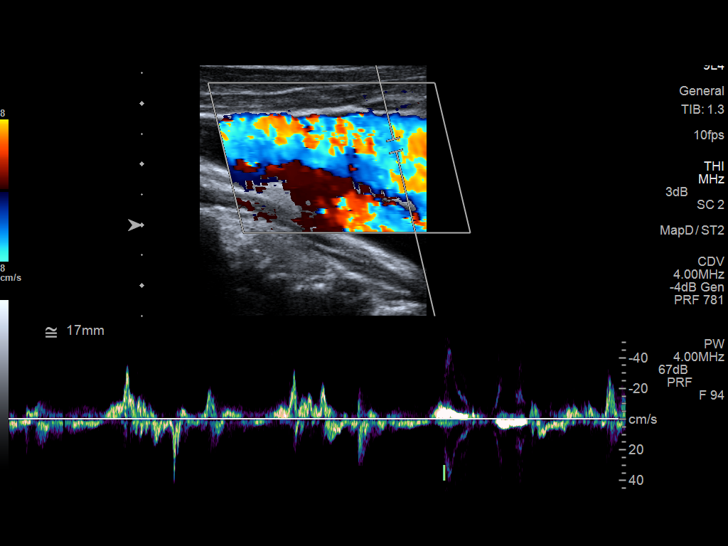
[im 9/35]
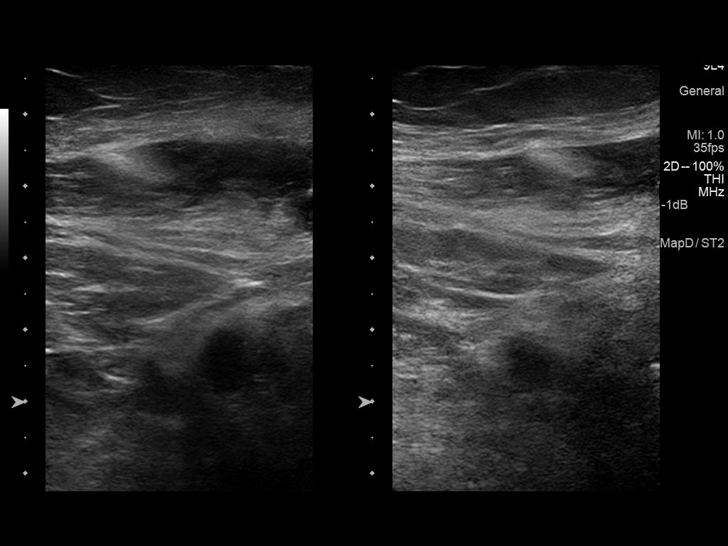
[im 12/35]
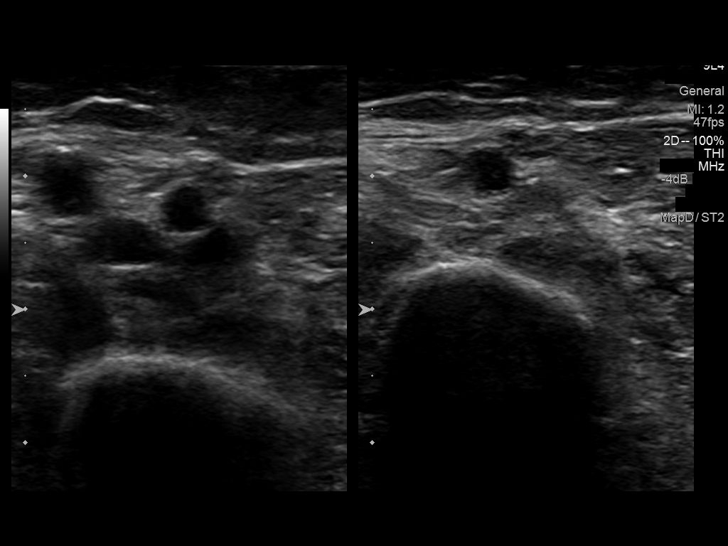
[im 15/35]
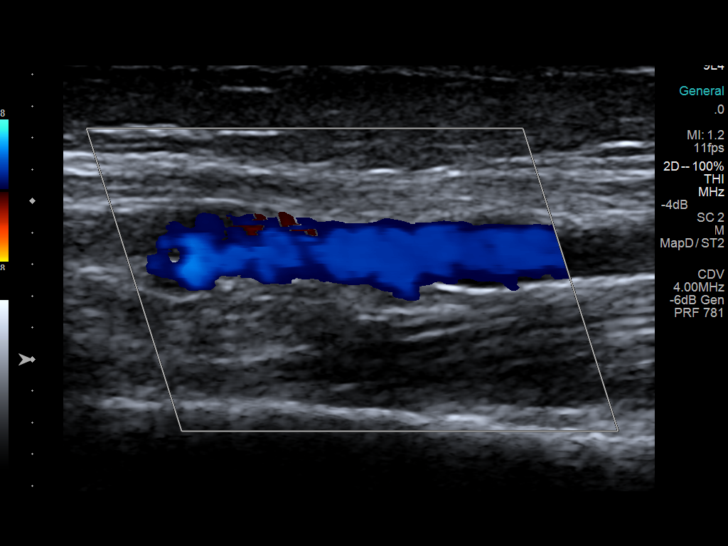
[im 18/35]
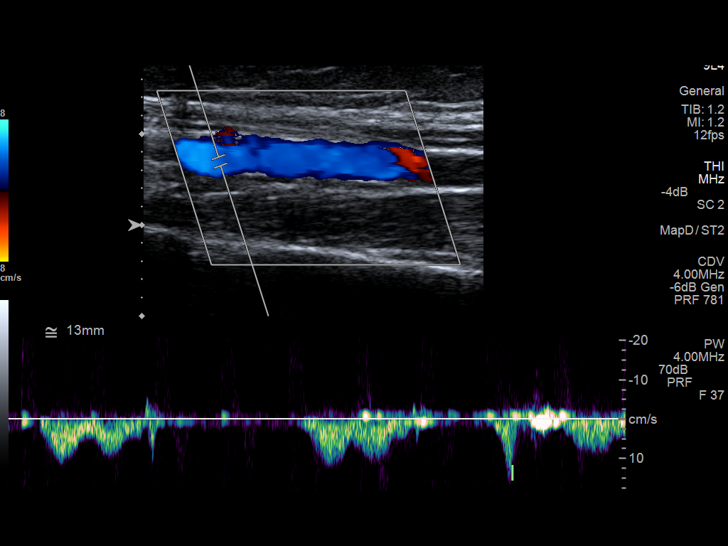
[im 20/35]
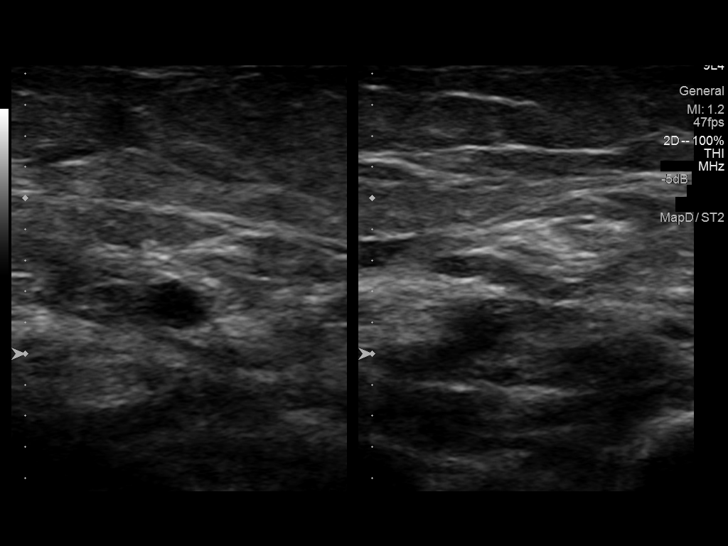
[im 23/35]
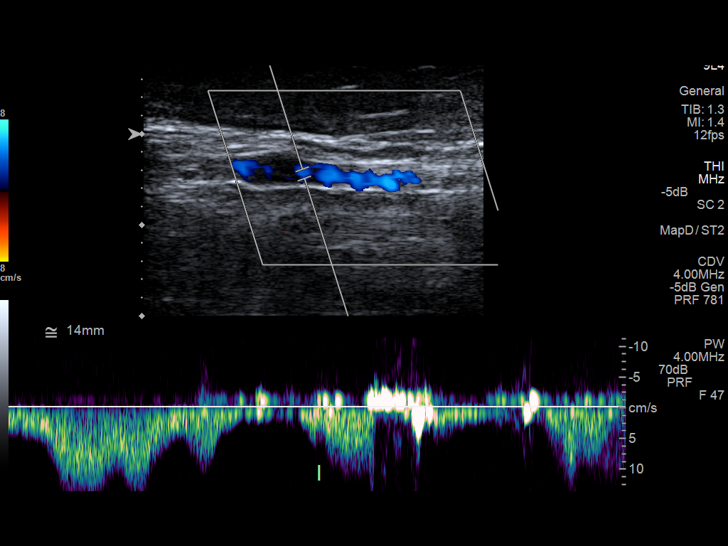
[im 26/35]
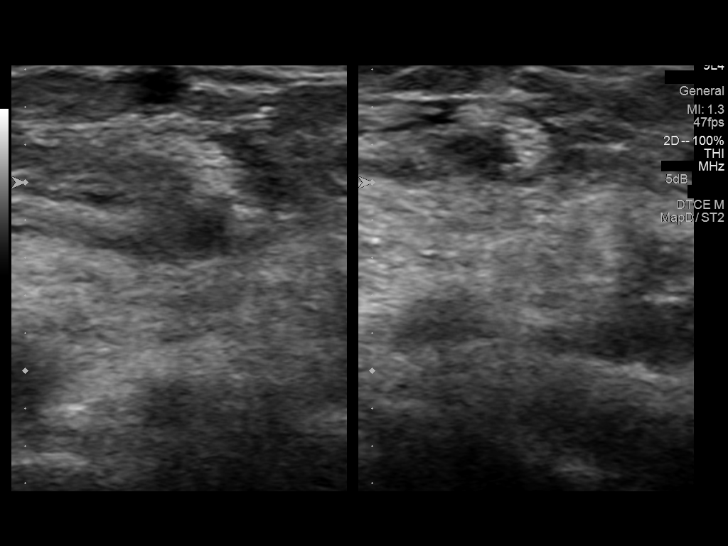
[im 29/35]
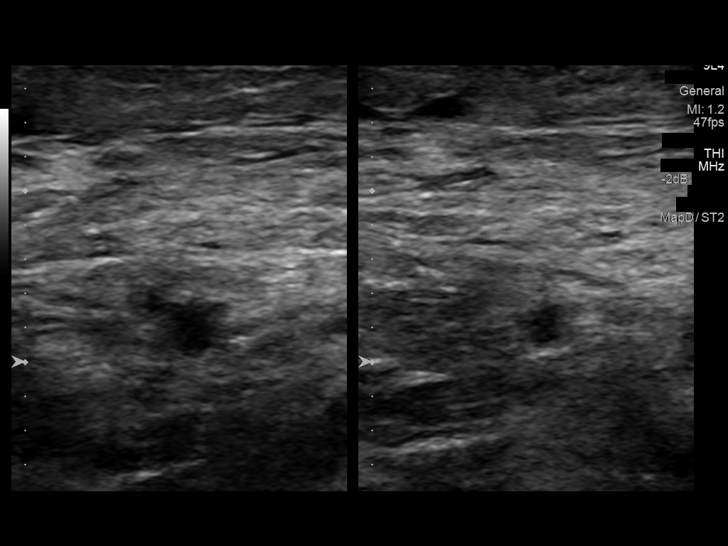
[im 32/35]
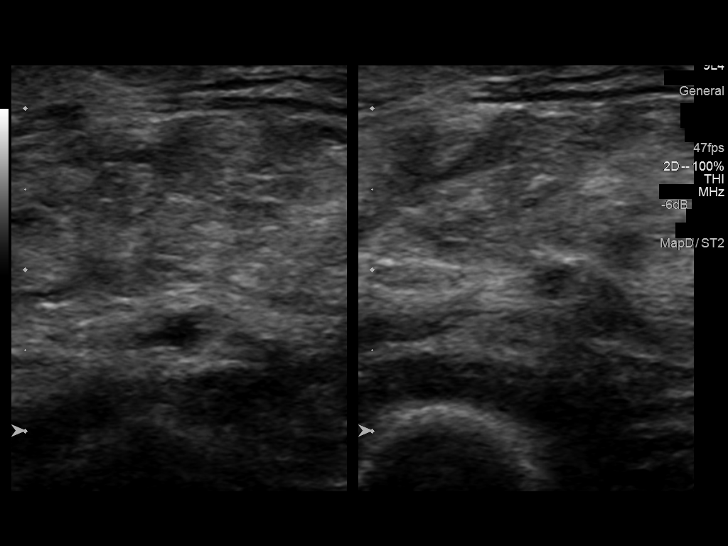
[im 35/35]
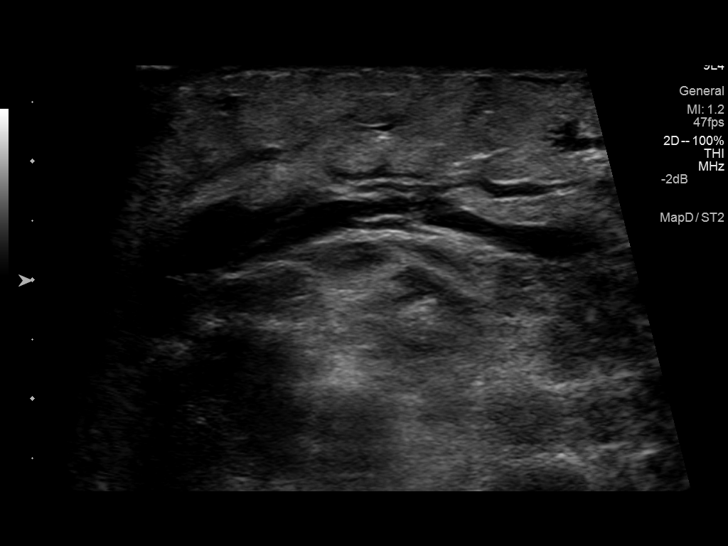

[13 of 24 positions shown; findings below may reference images not displayed]

FINDINGS: Contralateral Subclavian Vein: Respiratory phasicity is normal and
symmetric with the symptomatic side. No evidence of thrombus. Normal
compressibility.

Internal Jugular Vein: No evidence of thrombus. Normal
compressibility, respiratory phasicity and response to augmentation.

Subclavian Vein: No evidence of thrombus. Normal compressibility,
respiratory phasicity and response to augmentation.

Axillary Vein: No evidence of thrombus. Normal compressibility,
respiratory phasicity and response to augmentation.

Cephalic Vein: No evidence of thrombus. Normal compressibility,
respiratory phasicity and response to augmentation.

Basilic Vein: No evidence of thrombus. Normal compressibility,
respiratory phasicity and response to augmentation.

Brachial Veins: No evidence of thrombus. Normal compressibility,
respiratory phasicity and response to augmentation.

Radial Veins: No evidence of thrombus. Normal compressibility,
respiratory phasicity and response to augmentation.

Ulnar Veins: No evidence of thrombus. Normal compressibility,
respiratory phasicity and response to augmentation.

Venous Reflux:  None visualized.

Other Findings:  None visualized.
IMPRESSION: No evidence of deep venous thrombosis.

## 2016-12-28 IMAGING — CT CT HEAD WITHOUT CONTRAST
1 series · 16 of 29 positions shown, 20 images · non-contrast
Comparison: 01/12/2015

CLINICAL DATA: Worsening mental status. No history of stroke or
cancer. Seizures.

EXAM:
CT HEAD WITHOUT CONTRAST
TECHNIQUE: Contiguous axial images were obtained from the base of the skull
through the vertex without intravenous contrast.

[Series 2: soft tissue · axial · 0.38mm/px · z∈[+48,+178]mm · 16 of 29 slices shown, 20 images]
[im 2/29  brain]
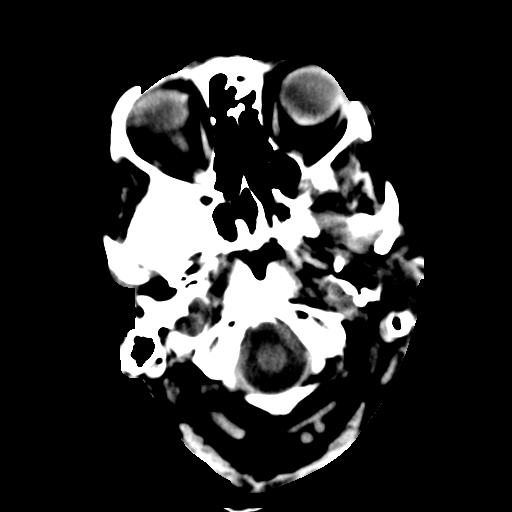
[im 2/29  bone]
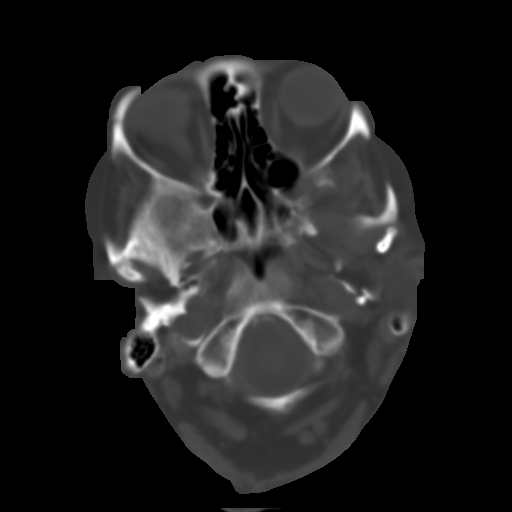
[im 4/29  brain]
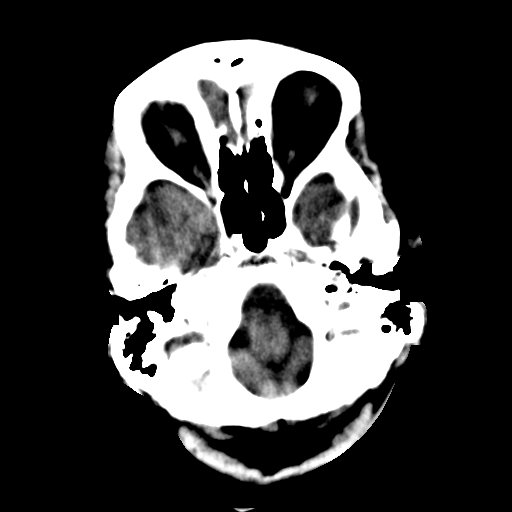
[im 6/29  brain]
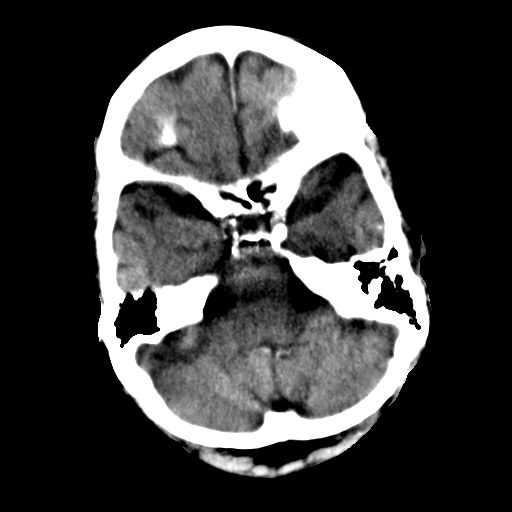
[im 7/29  brain]
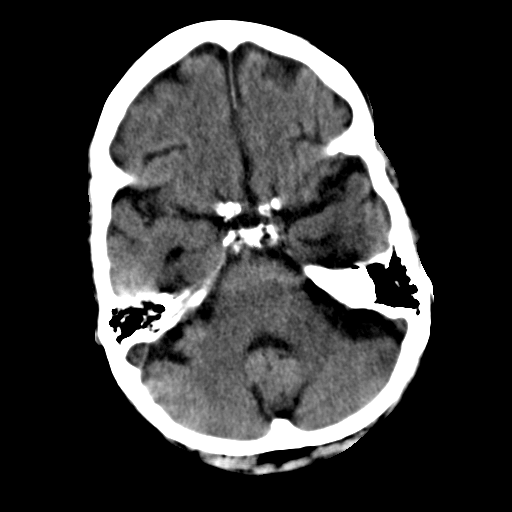
[im 9/29  brain]
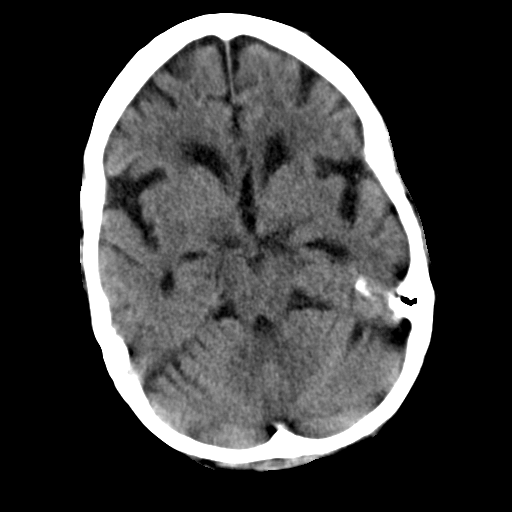
[im 9/29  bone]
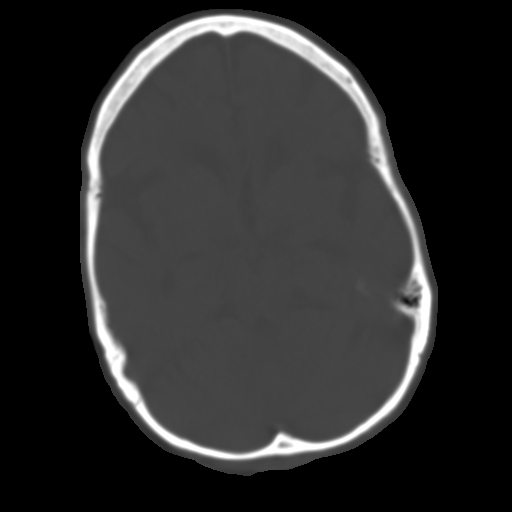
[im 11/29  brain]
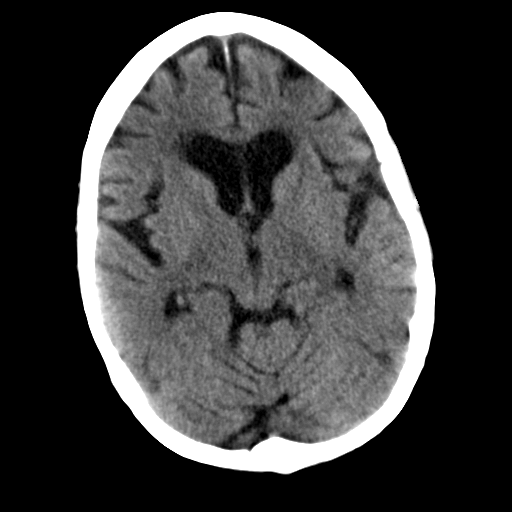
[im 12/29  brain]
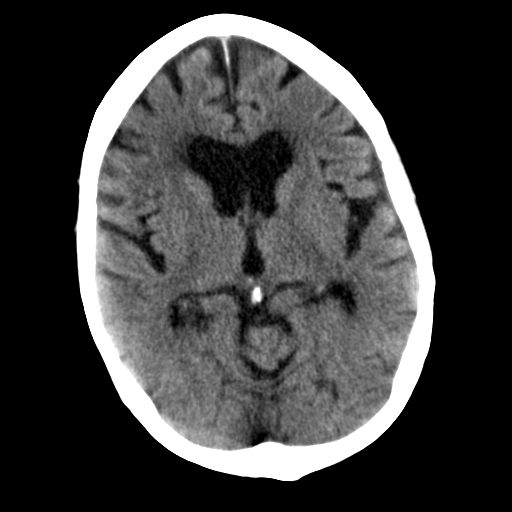
[im 14/29  brain]
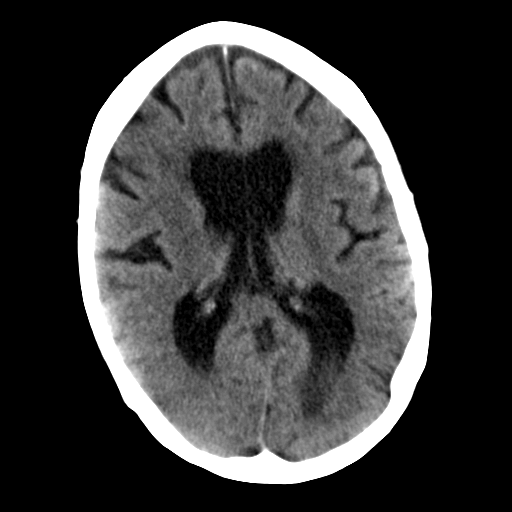
[im 16/29  brain]
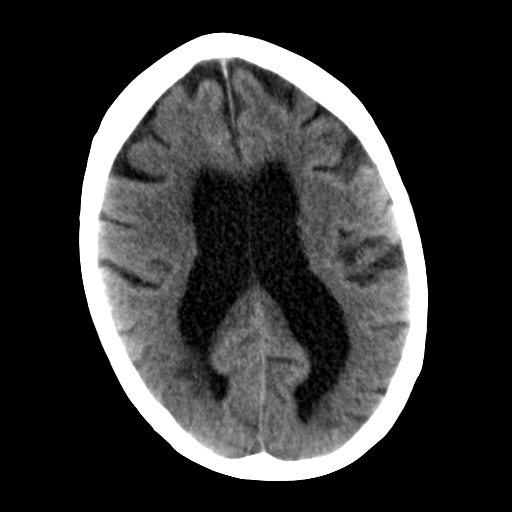
[im 16/29  bone]
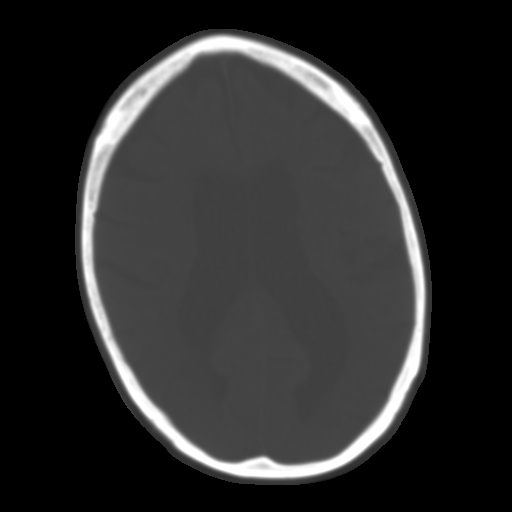
[im 18/29  brain]
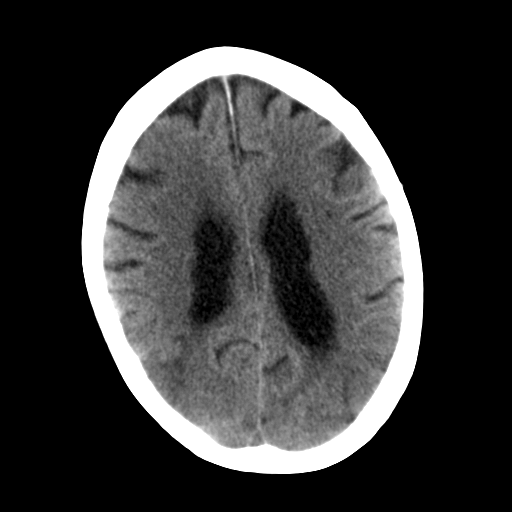
[im 19/29  brain]
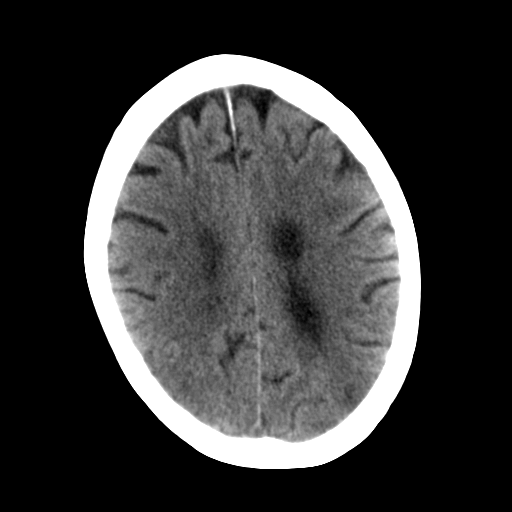
[im 21/29  brain]
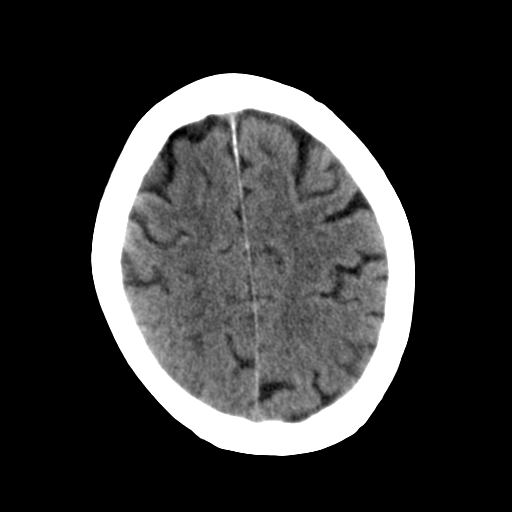
[im 23/29  brain]
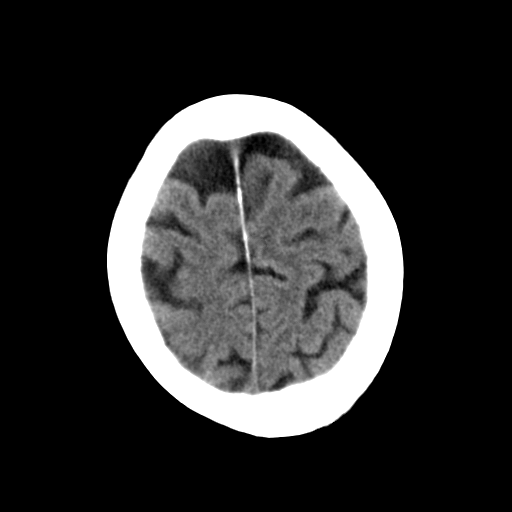
[im 23/29  bone]
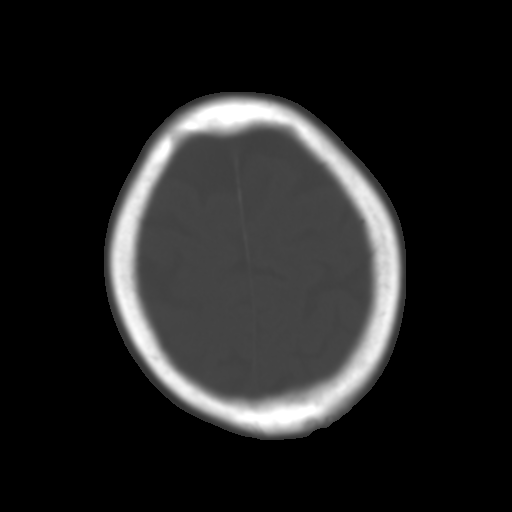
[im 24/29  brain]
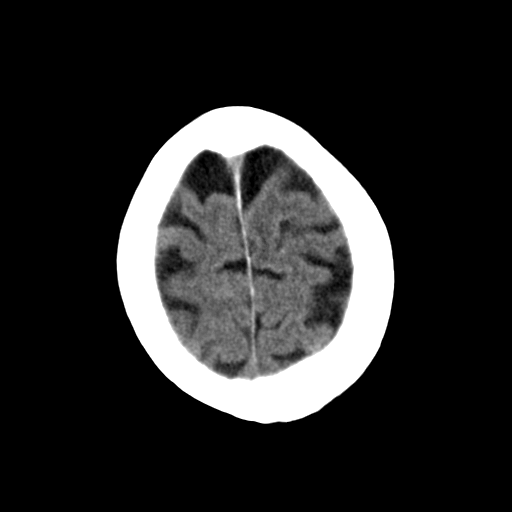
[im 26/29  brain]
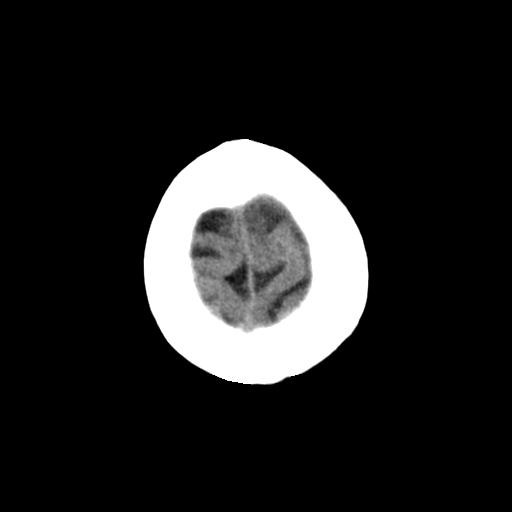
[im 28/29  brain]
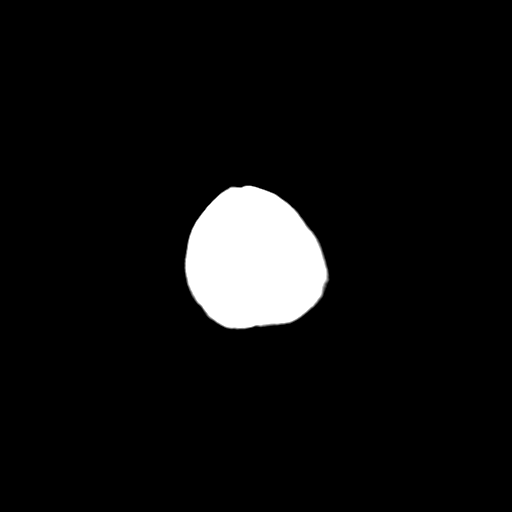

[16 of 29 positions shown; findings below may reference images not displayed]

FINDINGS: There is moderate central and cortical atrophy. Periventricular
white matter changes are consistent with small vessel disease. There
is no intra or extra-axial fluid collection or mass lesion. The
basilar cisterns and ventricles have a normal appearance. There is
no CT evidence for acute infarction or hemorrhage. No calvarial
fracture. Atherosclerotic calcification of the internal carotid
arteries. Paranasal and mastoid air cells are normally aerated.
IMPRESSION: 1. Atrophy and small vessel disease.
2.  No evidence for acute intracranial abnormality.
